# Patient Record
Sex: Male | Born: 1955 | Race: Black or African American | Hispanic: No | Marital: Married | Smoking: Never smoker
Health system: Southern US, Community
[De-identification: ages and names within clinical notes are randomized; demographics above are authoritative.]

## PROBLEM LIST (undated history)

## (undated) DIAGNOSIS — R0600 Dyspnea, unspecified: Secondary | ICD-10-CM

## (undated) DIAGNOSIS — I10 Essential (primary) hypertension: Secondary | ICD-10-CM

## (undated) DIAGNOSIS — M150 Primary generalized (osteo)arthritis: Secondary | ICD-10-CM

## (undated) DIAGNOSIS — J45909 Unspecified asthma, uncomplicated: Secondary | ICD-10-CM

## (undated) DIAGNOSIS — R51 Headache: Secondary | ICD-10-CM

## (undated) DIAGNOSIS — E785 Hyperlipidemia, unspecified: Secondary | ICD-10-CM

## (undated) DIAGNOSIS — R519 Headache, unspecified: Secondary | ICD-10-CM

## (undated) DIAGNOSIS — N289 Disorder of kidney and ureter, unspecified: Secondary | ICD-10-CM

## (undated) DIAGNOSIS — M0600 Rheumatoid arthritis without rheumatoid factor, unspecified site: Secondary | ICD-10-CM

## (undated) HISTORY — PX: BACK SURGERY: SHX140

## (undated) HISTORY — DX: Essential (primary) hypertension: I10

## (undated) HISTORY — PX: OTHER SURGICAL HISTORY: SHX169

## (undated) HISTORY — DX: Rheumatoid arthritis without rheumatoid factor, unspecified site: M06.00

## (undated) HISTORY — DX: Hyperlipidemia, unspecified: E78.5

## (undated) HISTORY — PX: TOTAL HIP ARTHROPLASTY: SHX124

## (undated) HISTORY — DX: Disorder of kidney and ureter, unspecified: N28.9

## (undated) HISTORY — PX: EYE SURGERY: SHX253

## (undated) HISTORY — DX: Primary generalized (osteo)arthritis: M15.0

---

## 2000-03-20 ENCOUNTER — Ambulatory Visit (HOSPITAL_COMMUNITY): Admission: RE | Admit: 2000-03-20 | Discharge: 2000-03-20 | Payer: Self-pay | Admitting: Orthopedic Surgery

## 2000-03-20 ENCOUNTER — Encounter: Payer: Self-pay | Admitting: Orthopedic Surgery

## 2000-12-15 ENCOUNTER — Ambulatory Visit (HOSPITAL_COMMUNITY): Admission: RE | Admit: 2000-12-15 | Discharge: 2000-12-15 | Payer: Self-pay | Admitting: Internal Medicine

## 2001-04-19 ENCOUNTER — Encounter: Payer: Self-pay | Admitting: Orthopedic Surgery

## 2001-04-19 ENCOUNTER — Ambulatory Visit (HOSPITAL_COMMUNITY): Admission: RE | Admit: 2001-04-19 | Discharge: 2001-04-19 | Payer: Self-pay | Admitting: Orthopedic Surgery

## 2001-06-23 ENCOUNTER — Encounter: Payer: Self-pay | Admitting: Specialist

## 2001-06-30 ENCOUNTER — Inpatient Hospital Stay (HOSPITAL_COMMUNITY): Admission: RE | Admit: 2001-06-30 | Discharge: 2001-07-05 | Payer: Self-pay | Admitting: Specialist

## 2001-07-01 ENCOUNTER — Encounter: Payer: Self-pay | Admitting: Specialist

## 2001-10-06 ENCOUNTER — Ambulatory Visit (HOSPITAL_COMMUNITY): Admission: RE | Admit: 2001-10-06 | Discharge: 2001-10-06 | Payer: Self-pay | Admitting: *Deleted

## 2002-05-09 ENCOUNTER — Emergency Department (HOSPITAL_COMMUNITY): Admission: EM | Admit: 2002-05-09 | Discharge: 2002-05-09 | Payer: Self-pay | Admitting: Emergency Medicine

## 2002-05-09 ENCOUNTER — Inpatient Hospital Stay (HOSPITAL_COMMUNITY): Admission: EM | Admit: 2002-05-09 | Discharge: 2002-05-10 | Payer: Self-pay | Admitting: Cardiology

## 2002-05-09 ENCOUNTER — Encounter: Payer: Self-pay | Admitting: Emergency Medicine

## 2002-05-10 ENCOUNTER — Encounter: Payer: Self-pay | Admitting: Cardiology

## 2003-11-13 ENCOUNTER — Encounter: Admission: RE | Admit: 2003-11-13 | Discharge: 2003-11-13 | Payer: Self-pay | Admitting: Internal Medicine

## 2004-01-31 ENCOUNTER — Ambulatory Visit (HOSPITAL_COMMUNITY): Admission: RE | Admit: 2004-01-31 | Discharge: 2004-01-31 | Payer: Self-pay | Admitting: *Deleted

## 2004-12-31 ENCOUNTER — Ambulatory Visit: Payer: Self-pay | Admitting: Internal Medicine

## 2006-07-22 ENCOUNTER — Emergency Department (HOSPITAL_COMMUNITY): Admission: EM | Admit: 2006-07-22 | Discharge: 2006-07-22 | Payer: Self-pay | Admitting: Emergency Medicine

## 2009-02-28 HISTORY — PX: OTHER SURGICAL HISTORY: SHX169

## 2009-03-24 ENCOUNTER — Inpatient Hospital Stay (HOSPITAL_COMMUNITY): Admission: RE | Admit: 2009-03-24 | Discharge: 2009-03-27 | Payer: Self-pay | Admitting: Specialist

## 2010-09-03 LAB — BASIC METABOLIC PANEL
BUN: 14 mg/dL (ref 6–23)
BUN: 17 mg/dL (ref 6–23)
CO2: 27 mEq/L (ref 19–32)
Calcium: 8.2 mg/dL — ABNORMAL LOW (ref 8.4–10.5)
Calcium: 8.2 mg/dL — ABNORMAL LOW (ref 8.4–10.5)
Chloride: 103 mEq/L (ref 96–112)
Creatinine, Ser: 1.14 mg/dL (ref 0.4–1.5)
GFR calc Af Amer: >60 mL/min (ref >60)
Glucose, Bld: 124 mg/dL — ABNORMAL HIGH (ref 70–99)
Potassium: 4.6 mEq/L (ref 3.5–5.1)
Potassium: 4.8 mEq/L (ref 3.5–5.1)
Sodium: 135 mEq/L (ref 135–145)

## 2010-09-03 LAB — CBC
HCT: 31.1 % — ABNORMAL LOW (ref 39.0–52.0)
HCT: 33 % — ABNORMAL LOW (ref 39.0–52.0)
Hemoglobin: 10.6 g/dL — ABNORMAL LOW (ref 13.0–17.0)
Hemoglobin: 11.1 g/dL — ABNORMAL LOW (ref 13.0–17.0)
Hemoglobin: 9 g/dL — ABNORMAL LOW (ref 13.0–17.0)
Hemoglobin: 9.6 g/dL — ABNORMAL LOW (ref 13.0–17.0)
MCHC: 33.6 g/dL (ref 30.0–36.0)
MCHC: 33.8 g/dL (ref 30.0–36.0)
MCHC: 34 g/dL (ref 30.0–36.0)
MCV: 95.5 fL (ref 78.0–100.0)
MCV: 96.3 fL (ref 78.0–100.0)
Platelets: 324 10*3/uL (ref 150–400)
Platelets: 345 10*3/uL (ref 150–400)
Platelets: 398 10*3/uL (ref 150–400)
RDW: 13 % (ref 11.5–15.5)
WBC: 3.9 10*3/uL — ABNORMAL LOW (ref 4.0–10.5)
WBC: 9.3 10*3/uL (ref 4.0–10.5)

## 2010-09-03 LAB — COMPREHENSIVE METABOLIC PANEL
ALT: 20 U/L (ref 0–53)
AST: 21 U/L (ref 0–37)
Albumin: 3.3 g/dL — ABNORMAL LOW (ref 3.5–5.2)
Alkaline Phosphatase: 69 U/L (ref 39–117)
Chloride: 104 mEq/L (ref 96–112)
GFR calc non Af Amer: >60 mL/min (ref >60)
Sodium: 139 mEq/L (ref 135–145)

## 2010-09-03 LAB — CROSSMATCH
ABO/RH(D): O POS
Antibody Screen: NEGATIVE

## 2010-09-03 LAB — URINALYSIS, ROUTINE W REFLEX MICROSCOPIC
Glucose, UA: NEGATIVE mg/dL
Hgb urine dipstick: NEGATIVE
Ketones, ur: NEGATIVE mg/dL
Nitrite: NEGATIVE
Protein, ur: NEGATIVE mg/dL
Specific Gravity, Urine: 1.026 (ref 1.005–1.030)
Urobilinogen, UA: 0.2 mg/dL (ref 0.0–1.0)
pH: 5.5 (ref 5.0–8.0)

## 2010-09-03 LAB — DIFFERENTIAL
Basophils Relative: 0 % (ref 0–1)
Eosinophils Relative: 7 % — ABNORMAL HIGH (ref 0–5)
Lymphocytes Relative: 27 % (ref 12–46)
Monocytes Absolute: 0.4 10*3/uL (ref 0.1–1.0)
Neutrophils Relative %: 56 % (ref 43–77)

## 2010-09-03 LAB — APTT: aPTT: 30 seconds (ref 24–37)

## 2010-10-16 NOTE — H&P (Signed)
[facility]  Patient:    [name] [name] Visit Number: [id number] MRN: [id number]          Service Type: Attending:  R. Valma Cava, M.D. Dictated by:   Ottie Glazier. Wynona Neat, P.A.-C.                           History and Physical  DATE OF BIRTH:  July 03, 1955  SOCIAL SECURITY #:  [id number]  CHIEF COMPLAINT:  Right knee pain.  HISTORY OF PRESENT ILLNESS:  [name] [name] is a 55 year old black man who has a long history of right knee pain beginning in the late 1970s.  The patient states that he has had an increase in frequency and duration of his pain since that time.  In fact, he has undergone an apparent open debridement in 1976, with two subsequent arthroscopies thereafter.  The patient complains again of increasing worsening pain that now interferes with his activities of daily living and his ambulation.  After a lengthy discussion with the patient concerning the options and treatment of the knee, the patient has opted for surgical intervention, and he is to undergo a total knee arthroplasty.  ALLERGIES:  The patient has no known drug allergies.  CURRENT MEDICATIONS: 1. Arthrotec 75 mg 1 p.o. q.d. p.r.n. 2. Celebrex 200 mg 1 p.o. q.d. p.r.n. 3. ______ 100/650 1 p.o. q.h.s. 4. AndroGel q.d.  PAST MEDICAL HISTORY: 1. Spinal stenosis. 2. Low back pain. 3. History of osteoarthritis of right knee.  PAST SURGICAL HISTORY:  Right knee surgery x 3.  SOCIAL HISTORY:  The patient is married.  He has one daughter.  Denies any tobacco or alcohol use.  He lives in a two-level home currently.  He has plans for possible home health physical therapy.  FAMILY HISTORY:  The patient states his mother had breast cancer as well as hyperthyroidism and arthritis.  REVIEW OF SYSTEMS:  The patient denies any recent fever or chills.  He states that he has had a long history of night sweats.  There has been no increase in weight or appetite.  HEENT:  The  patient does wear corrective lenses.  He denies any history of chronic headaches.  He does have occasional tinnitus. He denies any seeing of spots or specks.  He denies any sore throat or runny nose.  CHEST:  He denies any history of chronic cough or productive cough or hemoptysis.  CARDIOVASCULAR:  He denies any chest pain, irregular heartbeat, syncopal episodes. GENITOURINARY, GENITOURINARY:  He denies any history of diarrhea, constipation, melena, bright red stools per rectum, or dysuria. EXTREMITIES:  Please see HPI.  NEUROLOGIC:  The patient states he does have occasional right lower extremity numbness, tingling, radicular-type pain secondary to spinal stenosis.  PRIMARY CARE PHYSICIAN:  Dr. Jearl [name], who is no longer at the practice; therefore, the patient must take a new [name] [name].  He states that he does have an appointment for medical clearance.  PHYSICAL EXAMINATION:  VITAL SIGNS:  Blood pressure 128/90, respirations 16, pulse 76.  GENERAL:  Very pleasant 55 year old black male in no acute distress, appearing his stated age.  HEENT:  Head atraumatic, normocephalic.  NECK:  Supple.  Without masses.  CHEST:  Clear to auscultation bilaterally.  HEART:  Regular rate and rhythm.  S1, S2.  ABDOMEN:  Soft and nontender.  Bowel sounds positive x 4 quadrants.  EXTREMITIES:  The patient has fixed valgus of the knee with 90 degrees of flexion.  Previous surgery with lateral incision marks.  Awkward valgus thrust.  Opposite knee has a worn medial compartment.  GENITOURINARY:  Deferred.  Not pertinent to present illness.  LABORATORY DATA:  X-rays reveal valgus alignment of the right knee with a significant amount of osteoarthritic changes of the medial compartment.  ASSESSMENT: 1. Right knee osteoarthritis. 2. History of low back pain secondary to spinal stenosis.  PLAN:  The patient will be admitted to [facility] to undergo a right total knee arthroplasty per Dr. Benny Lennert on June 30, 2001, at 12:30 p.m. The risks and benefits of this procedure have been discussed with the patient in great detail, to which he states good understanding.  The patient is to obtain medical clearance today.  This H&P was taken on June 23, 2001; however, I am dictating it on July 03, 2001. Dictated by:   Ottie Glazier. Wynona Neat, P.A.-C. Attending:  R. Valma Cava, M.D. DD:  07/03/01 TD:  07/03/01 Job: [id number]

## 2010-10-16 NOTE — Discharge Summary (Signed)
[facility]  Patient:    [name], [name] Visit Number: [id number] MRN: [id number]          Service Type: SUR Location: 4W 0482 01 Attending Physician:  Erasmo Leventhal Dictated by:   Marcie Bal Velna Ochs, P.A.-C Admit Date:  06/30/2001 Discharge Date: 07/05/2001   CC:         Dr. Tonye Pearson   Discharge Summary  PRIMARY DIAGNOSES: 1. End stage osteoarthritis, right knee. 2. Postoperative hypoxemia. 3. Acute blood loss anemia. 4. Hyponatremia. 5. Hypocalcemia.  SECONDARY DIAGNOSES: 1. Hypercholesterolemia. 2. Iron-deficiency anemia. 3. Depression. 4. Hypogonadism. 5. Obesity.  SURGICAL PROCEDURE:  Right total knee arthroplasty by Dr. Thomasena Edis with the assistance of Marcie Bal. Troncale, P.A.-C on June 30, 2001.  Please see operative summary for further details.  CONSULTATIONS:  Dr. Tonye Pearson.  LABORATORY DATA:  Preoperative hemoglobin and hematocrit were 10.8 and 32.3, respectively.  This reached a low on July 02, 2001, of 8.2 and 24.6, respectively.  This improved to 9.7 hemoglobin on February 3 and July 04, 2001, after transfusion.  Preoperative PT, INR, and PTT were 13.3, 1.0, and 34, respectively.  He had a therapeutic INR up to 2.1 starting on July 03, 2001.  Routine chemistry preoperatively was all within normal limits with a sodium and potassium of 140 and 4.0, respectively.  BUN and creatinine were 9 and 1.0.  He had a low sodium on July 01, 2001, of 131.  This improved to 136 on July 04, 2001.  Also, calcium was low at 7.7 on July 02, 2001, improved to 8.4 on July 04, 2001.  Cardiac enzymes obtained on July 01, 2001, initially, and a series of three, showed CK in the range of 134 to 162, relative index of 0.4 to 0.6, CK-MB of 0.6 to 1.0.  Urinalysis on June 23, 2001, was all within normal limits with negative parameters.  A blood gas on room air on July 01, 2001, showed a pH of 7.32, PCO2  51, PO2 of 53, O2 saturation of 84.7.  EKG on July 01, 2001, showed normal sinus rhythm, no ST changes.  Chest x-ray on June 23, 2001, showed mild cardiomegaly and no active disease.  Followup on July 01, 2001, showed right basilar atelectasis.  Spiral CT of the chest showed no evidence of acute pulmonary embolism.  CHIEF COMPLAINT:  Right knee pain.  HISTORY OF PRESENT ILLNESS:  [name] [name] is a 55 year old male who presents with complaints of right knee pain he has had since the late 1970s.  He had undergone an open debridement in 1976, with two subsequently knee arthroscopies.  He has continued to have pain, his pain now is to the point that it is interfering with his activities of daily living and his ambulation. He has failed conservative measures, and now has elected to undergo surgical intervention.  He saw Dr. Lanae Boast preoperatively for preoperative medical evaluation and clearance, and it was felt that he was stable to proceed with surgery.  HOSPITAL COURSE:  Following the surgical procedure, the patient was taken to the PACU in stable condition, transferred to the orthopedic floor in good condition.  He received routine postoperative antibiotics and pain medications.  He was on Coumadin for deep vein thrombosis prophylaxis, and had a therapeutic INR prior to discharge.  On postoperative day #1, in the evening, however, he became somewhat belligerent with the staff and apparently threw his meal tray.  Then he was noted to have a decreased pulse oximetry of  80%.  I came in to evaluate the patient at that time.  The patient was found to be alert and oriented and without any chest pain, discomfort, or shortness of breath.  He was slightly tachycardic, and had a low pulse oximetry on room air.  An ABG was obtained on room air which did show an increased AA gradient as well as respiratory acidosis.  An EKG was obtained which showed normal sinus rhythm, but no ST changes.   Cardiac enzymes were ordered, as well as supplemental oxygen.  Case was discussed with Dr. Ranell Patrick who is covering for Dr. Thomasena Edis.  He agreed with the workup at that point, and recommended we contact the medical physician.  Dr. Lanae Boast was consulted, and she agreed with the initial management and recommended we continue to titrate his oxygen, obtain a chest x-ray, and a spiral CT of the chest.  Also, she recommended we start Lovenox in the meantime as deep vein thrombosis prophylaxis until the Coumadin was therapeutic.  Followup cardiac enzymes all were within normal limits with no evidence of cardiac ischemia.  CT of the chest also was negative for PE.  On July 02, 2001, he was found to have a low hemoglobin and was transfused 2 units of packed red blood cells which brought this up, and he had no further problems.  He actually is feeling better on July 02, 2001, and continued to improve at that point.  He was treated for hyponatremia and hypocalcemia by Dr. Lanae Boast.  The patient progressed slow with therapy. Continued to encourage him to participate in therapy, and he did make some slow, but notable strides with therapy.  By postoperative day #5, it was felt he was medically and orthopedically stable, and had progressed well enough for discharge home.  DISPOSITION:  The patient is being discharged home with [facility] physical therapy, as well as a home CPM machine.  DIET:  Regular.  ACTIVITY:  Weightbearing as tolerated.  Total knee protocol.  Home CPM. Aggressive physical therapy and knee range of motion.  WOUND CARE:  Once daily dressing changes to the knee.  Okay to shower once daily.  DISCHARGE MEDICATIONS: 1. Coumadin per pharmacy dosing. 2. Trinsicon one p.o. t.i.d. p.c. 3. OxyContin 20 mg p.o. b.i.d. 4. Oxy IR one p.o. q.4h. p.r.n. breakthrough pain. 5. Robaxin 500 mg p.o. q.8h. p.r.n. spasm.  FOLLOWUP: 1. Dr. Thomasena Edis in 10 days.  Call [phone] for an  appointment. 2. Dr. Lanae Boast per her instructions.   CONDITION ON DISCHARGE:  Good and improved. Dictated by:   Marcie Bal Velna Ochs, P.A.-C Attending Physician:  Erasmo Leventhal DD:  07/12/01 TD:  07/12/01 Job: [id number]

## 2010-10-16 NOTE — Discharge Summary (Signed)
NAME:  [name] [name], [name] [name]                          ACCOUNT NO.:  [id number]   MEDICAL RECORD NO.:  [id number]                   PATIENT TYPE:  INP   LOCATION:  3741                                 FACILITY:  [facility]   PHYSICIAN:  Jonelle Sidle, M.D. [facility]        DATE OF BIRTH:  [birth year]   DATE OF ADMISSION:  05/09/2002  DATE OF DISCHARGE:  05/10/2002                           DISCHARGE SUMMARY - REFERRING   PROCEDURES:  1. Exercise stress Cardiolite, May 10, 2002.  2. Chest CT scan.   REASON FOR ADMISSION:  Please refer to dictated admission note.   LABORATORY DATA:  WBC 4.1, hemoglobin 11.3, hematocrit 33.3, platelets  389,000.  D-dimer 1.00, INR 0.9.  Sodium 137, potassium 4.2, glucose 118,  BUN 10, creatinine 1.1; decreased albumin at 3.2, otherwise, normal liver  enzymes.  Cardiac enzymes:  CPK-MB and troponin I markers normal (x4).  Lipid panel:  Total cholesterol 184, triglycerides 49, HDL 38, LDL 136  (ratio 4.8).  Urinalysis negative.   Admission CXR:  No active disease.   HOSPITAL COURSE:  The patient presented with chest pain which was felt to be  atypical for ischemic heart disease.  Serial EKGs showed no definite  ischemia.  Serial cardiac enzymes were all within normal limits.  The  patient was thus referred for exercise stress Cardiolite testing.   Exercise stress test notable for no report of chest pain during procedure.  Hypertensive response was noted (200/100).  Perfusion images revealed no  evidence of ischemia; calculated ejection fraction of 60%.   Given the elevated D-dimer (1.0) on admission, the patient was also referred  for a CT scan of the chest; this was negative for pulmonary embolus as well.   No further cardiac workup was recommended.  The patient will be discharged  on Protonix.   Of note, the CT scan of the chest did also reveal a 1.8-cm right hilar node.  The patient reports no history of tobacco smoking.  Recommendation is to  have a  followup CT scan in three months.   The patient apparently has no primary care physician and will need to  establish with one within the next several weeks.   MEDICATIONS AT DISCHARGE:  Protonix 40 mg every day.   DISCHARGE INSTRUCTIONS:  Resume previous level of activity as tolerated.   The patient is strongly urged to re-establish with her primary care  physician.   DISCHARGE DIAGNOSIS:  1. Non-ischemia chest pain.     a. Normal serial cardiac enzymes.     b. Negative exercise stress Cardiolite.  2. Elevated D-dimer.     a. Negative chest CT scan for pulmonary embolus.  3. Right hilar node.     a. Recommend repeat CT scan in three months.  4. Dyslipidemia.  5. History of anxiety.  6. Childhood asthma.       Gene Serpe, P.A. [facility]  Jonelle Sidle, M.D. [facility]    GS/MEDQ  D:  05/10/2002  T:  05/11/2002  Job:  [id number]

## 2010-10-16 NOTE — Procedures (Signed)
[facility]  Patient:    AZION, CENTRELLA Visit Number: [id number] MRN: [id number]          Service Type: END Location: ENDO Attending Physician:  Sabino Gasser Dictated by:   Sabino Gasser, M.D. Proc. Date: 10/06/01 Admit Date:  10/06/2001                             Procedure Report  PROCEDURE:  Upper endoscopy.  INDICATIONS:  GERD.  ANESTHESIA:  Demerol 80 mg, Versed 8 mg.  DESCRIPTION OF PROCEDURE:  With the patient mildly sedated in the left lateral decubitus position, the Olympus videoscopic endoscope was inserted in the mouth and passed under direct vision through the esophagus, which appeared normal, into the stomach through a hiatal hernia. The fundus, body, antrum, duodenal bulb, second portion of duodenum were viewed. From this point, the endoscope was slowly withdrawn taking circumferential views of the entire duodenal mucosa until the endoscope was then pulled back into the stomach and placed in retroflexion to view the stomach from below, and a hiatal hernia was once again seen. The endoscope was straightened and withdrawn taking circumferential views of the remaining gastric and esophageal mucosa. The patients vital signs and pulse oximeter remained stable. The patient tolerated the procedure well without apparent complications.  FINDINGS:  Hiatal hernia, otherwise unremarkable examination.  PLAN:  Continue present therapy and proceed to colonoscopy as planned. Dictated by:   Sabino Gasser, M.D. Attending Physician:  Sabino Gasser DD:  10/06/01 TD:  10/07/01 Job: [id number]

## 2010-10-16 NOTE — Procedures (Signed)
[facility]  Patient:    AUGUSTINO, SAVASTANO Visit Number: [id number] MRN: [id number]          Service Type: END Location: ENDO Attending Physician:  Sabino Gasser Dictated by:   Sabino Gasser, M.D. Proc. Date: 10/06/01 Admit Date:  10/06/2001                             Procedure Report  PROCEDURE:  Colonoscopy.  INDICATIONS:  Hemoccult positivity. Colon cancer screening.  ANESTHESIA:  Demerol 10 mg, Versed 1 mg additionally.  DESCRIPTION OF PROCEDURE:  With the patient mildly sedated in the left lateral decubitus position, the Olympus videoscopic colonoscope was inserted in the rectum after normal rectal exam and passed under direct vision to the cecum, identified by the ileocecal valve and appendiceal orifice, the latter of which was photographed. From this point, the colonoscope was slowly withdrawn taking circumferential views of the entire colonic mucosa stopping only then in then in the rectum which appeared normal in direct and retroflex view. The endoscope was straightened and withdrawn. The patients vital signs and pulse oximeter remained stable. The patient tolerated the procedure well without apparent complications.  FINDINGS:  Unremarkable colonoscopic examination.  PLAN:  Have patient follow up with Dr. Virginia Rochester as an outpatient. Dictated by:   Sabino Gasser, M.D. Attending Physician:  Sabino Gasser DD:  10/06/01 TD:  10/07/01 Job: [id number]

## 2010-10-16 NOTE — Op Note (Signed)
[facility]  Patient:    [name] [name], [name] [name] Visit Number: [id number] MRN: [id number]          Service Type: SUR Location: 4W 0482 01 Attending Physician:  Erasmo Leventhal Dictated by:   Elvera Lennox Valma Cava, M.D. Proc. Date: 06/30/01 Admit Date:  06/30/2001                             Operative Report  PREOPERATIVE DIAGNOSIS:  Right knee severe valgus malalignment with end-stage osteoarthritis.  POSTOPERATIVE DIAGNOSIS:  Right knee severe valgus malalignment with end-stage osteoarthritis.  PROCEDURE:  Right total knee arthroplasty.  SURGEON:  R. Valma Cava, M.D.  ASSISTANT:  Irena Cords, P.A.-C.  ANESTHESIA:  General.  ESTIMATED BLOOD LOSS:  Less than 50 cc.  DRAINS:  Two mini Hemovac.  COMPLICATIONS:  None.  TOURNIQUET TIME:  2 hours and 10 minutes at 375 mmHg.  COMPLICATIONS:  None.  DISPOSITION:  To PACU stable.  OPERATIVE IMPLANTS:  Osteonics components.  Posterior stabilized.  Size 9 femur, size 9 tibia, 10 mm polyethylene insert with a 26 mm patella.  All components cemented.  OPERATIVE DETAILS:  The patient had been counseled in the holding area and correct site was identified.  IV started.  Antibiotics were given.  The patient refused to have a Foley catheter placed before the surgical procedure.  He understands the risks and benefits, and he wished to proceed. He was taken to the OR, placed in a supine position, general anesthesia.  The right knee was examined, severe valgus malalignment and 10 degree flexion contracture.  He could only flex to 90 degrees.  Elevated, prepped with DuraPrep, and all draped in a sterile fashion.  Exsanguinated with an Esmarch, tourniquet was inflated to 375 mmHg due to the large size of his thigh.  He had two previous medial and lateral arthrotomy incisions from 1972.  This incision I used today was ______ down to preserve skin flaps, was made in the midline, somewhat biased in  towards the lateral sides.  Dissection and skin and subcutaneous tissue, medial and lateral skin flaps were developed at the appropriate level, and veins were electrocoagulated.  Medial parapatellar arthrotomy was performed, and the medial soft tissue over the proximal tibia was exposed, doing very minimal to no release there.  This patient had a very deformed knee with large osteophytes.  The osteophytes were removed from the patella and then from the patellofemoral joint and femoral trochlea, exposing the natural size of his femur.  The ACL was absent. The PCL was resected.  Posterior neurovascular structures were protected throughout the entire case and ______ off.  A starting hole made in the distal femur; canal was entered.  We chose a 7 degree valgus to try to correct him without injuring his common peroneal nerve with excessive postoperative correction.  Intramedullary awl was placed, set for 7 degree valgus cut and took a 12 mm cut off the distal femur which was adequate.  The distal femur was found to be a size #9.  Rotational marks were made, and the distal femur was cut to fit a size #9.  More osteophytes were also removed.  The tibia was very difficult.  The medial meniscus was resected.  Hemostasis was obtained in the lateral side.  There was no lateral meniscus remaining. He had a very scalloped sclerotic lateral tibial plateau.  This was a very difficult dissection to say the least.  Tibial eminence  was resected.  The proximal tibia was found to be a size #9.  Starting hole was made.  Step reamer was utilized, and the canal was irrigated until the effluent was clear. Intramedullary rod was gently placed.  I chose a 5 degree posterior slope and a 10 mm cut based upon the medial side which was the preserved side.  This was done, protecting the posterior neurovascular structures and the collateral ligaments and patella and extensor mechanism.  The proximal tibia was  cut appropriately.  It was found to be a size #9 again.  Posteromedial and posterolateral femoral osteophytes were removed under direct visualization, as they were also taken off the back of the tibia under direct visualization, again protecting the posterior neurovascular structures.  The femoral trochlea was prepared in a standard fashion.  At this point in time, with a size #9 femur, size #9 tibia with a 10 mm insert trial, we had excellent range of motion and soft tissue balance and alignment.  Rotational marks were made in the proximal tibia.  The Delta keel was performed in a standard fashion.  The lateral tibia bone was rather sclerotic, and drill holes were there to help with cement fixation.  The patella was found to be a size #26.  It was reamed to a depth of 10 mm. Locking holes were made.  At this time, utilizing a Modern cement technique, all components were cemented into place, size 9 tibia, size 9 femur with a 26 mm patella.  While the cement cured, we placed a 10 mm insert.  At this point, we had the 10 mm trial insert.  We had excellent alignment, range of motion, soft tissue balance.  Patella tracking was a little bit tilting laterally; therefore, a lateral release was performed, giving anatomic patellofemoral tracking. Geniculate vessels were identified and spared superolateral as coagulated.  The tibial trial was removed.  Excess cement was removed, and a final 10 mm tibial insert was placed.  Bone wax was placed on exposed bony surfaces.  A meticulous hemostasis was performed.  Two mini Hemovac drains were placed.  A sequential closure of layers was done, synovium with Vicryl, arthrotomy Vicryl, subcu Vicryl.  At this point in time, we were pressed on the tourniquet time, and I went ahead and stapled the skin also to make sure that after surgery if he had any areas of skin not being viable, that we could take out the staples at the appropriate level.  Then 20  cc of 0.5% Marcaine with epinephrine was put into the knee joint through  the drain.  A sterile compressive dressing was applied, and the tourniquet was deflated.  He had a normal pulse and circulation in the foot and ankle at the end of the case.  He was then awakened and extubated.  At this point in time, with the patient awake, I could make sure he could he could dorsiflex and planter flexion the foot well, and there was no motor, peroneal nerve dysfunction at all.  With that in mind, we then put him into a knee immobilizer in full extension.  Another gram of Ancef was given intravenously at the end of the case.  Sponge and needle counts were correct.  There were no complications.  He was awakened and had been extubated and taken from the operating room to PACU in stable condition.  ADDENDUM:  This was a very difficult knee due to the large body habitus of the patient and the severe knee  deformity that he had.  There were no complications noted at this time. Dictated by:   R. Valma Cava, M.D. Attending Physician:  Erasmo Leventhal DD:  06/30/01 TD:  07/01/01 Job: [id number]

## 2010-10-16 NOTE — Consult Note (Signed)
[facility]  Patient:    [name] [name], [name] [name] Visit Number: [id number] MRN: [id number]          Service Type: SUR Location: 4W [id number] 01 Attending Physician:  Erasmo Leventhal Dictated by:   Lilia Pro, M.D. Admit Date:  06/30/2001   CC:         R. Valma Cava, M.D.   Consultation Report  REASON FOR CONSULTATION:  Hypoxemia and hyponatremia and general medical management.  HISTORY:  This is a 55 year old African American male who was previously under the primary care of Dr. Trudee Kuster and whom I saw a couple of weeks ago for surgical clearance.  He underwent right total knee replacement on July 01, 2001, and was doing well postoperatively, up until last evening, when he developed some confusion, lethargy, and hypoxemia.  The PA for Dr. Thomasena Edis saw him and started him on some oxygen and ordered a CT scan of his chest which was negative for PE.  We discussed this briefly on the phone prior to him doing that.  I also recommended he start him on Lovenox at therapeutic doses which was done.  This morning, the patient is somewhat lethargic, but he says this is due to his medication.  The staff reports he is much better mentally than he was last night.  His pain is under good control with p.o. Percocet.  He has no complaints of chest pain or shortness of breath at this time.  No nausea or vomiting.  He is taking p.o. well.  PAST MEDICAL HISTORY:  He has a past medical history of: 1. Hypercholesterolemia. 2. Iron-deficiency anemia. 3. Depression. 4. Hypogonadism. 5. Severe osteoarthritis of the right knee. 6. Obesity.  PAST FAMILY/SOCIAL HISTORY:  I believe he may have a family history of coronary artery disease.  No history of diabetes.  He does work full time.  He lives with his wife.  He does not smoke tobacco or drink alcohol.  MEDICATIONS:  1. Colace b.i.d.  2. Iron tablet t.i.d.  3. Coumadin which was just started.  4. Morphine  p.r.n.  5. Lovenox which was just started.  6. Cefazolin IV.  7. Compazine p.r.n.  8. Reglan.  9. Zofran p.r.n. 10. Percocet p.r.n.  DRUG ALLERGIES:  None.  PHYSICAL EXAMINATION:  VITAL SIGNS:  Stable.  He is currently 97% on 4 L.  He is somewhat tachycardic with a heart rate of 107.  He has been so since admission.  GENERAL:  A well-developed African American male in no apparent distress, quite pleasant, somewhat lethargic, but answers all questions correctly and appropriately.  HEENT:  Extraocular movements intact.  Sclerae clear.  Oropharynx clear.  NECK:  Supple.  No lymphadenopathy.  HEART:  Regular rhythm with a tachycardic rate.  No murmurs auscultated.  LUNGS:  Clear to auscultation and nonlabored bilaterally.  ABDOMEN:  Nondistended, nontender, soft with positive bowel sounds.  No hepatosplenomegaly appreciated.  EXTREMITIES:  No clubbing, cyanosis, or edema.  NEUROLOGIC:  Cranial nerves II-XII, gait, of course, is not tested.  LABORATORY WORK:  Shows a hemoglobin that is 8.2 this morning.  This is down from 9.4 on admission.  I should say he was about 10 in the office when I saw him for clearance.  He has a sodium of 131, potassium 3.6, BUN 10, creatinine 1.1, calcium 7.7.  IMPRESSION/PLAN: 1. Agree with CT of chest, although I am glad this is negative for any PE.    Agree with oxygen supplementation for the  current time.  I think we should    continue the Lovenox at therapeutic dose for potential PE complications    in the future and until his Coumadin becomes therapeutic.     We will watch this closely.  I will check stool for blood while he is    here and do p.r.n. transfusions.  I agree with the slow and careful    transfusion with his risks for congestive heart failure.  I am in the    process of working up medical issues as an outpatient and I do believe    he has some risk for this with his high cholesterol and obesity and    family history. 2.  Hyponatremia.  I have changed his IV fluids to normal saline.  I will    Heplock this when he is getting blood and as he continues to take p.o.    we will simply Heplock his IV which will correct the sodium in and of    itself. 3. Hypocalcemia.  I will correct this p.o.  Thank you for this consult. Dictated by:   Lilia Pro, M.D. Attending Physician:  Erasmo Leventhal DD:  07/02/01 TD:  07/03/01 Job: [id number]

## 2010-10-16 NOTE — H&P (Signed)
NAME:  [name] [name], [name] [name] NO.:  [id number]   MEDICAL RECORD NO.:  [id number]                   PATIENT TYPE:  EMS   LOCATION:  ED                                   FACILITY:  [facility]   PHYSICIAN:  Jonelle Sidle, M.D. [facility]        DATE OF BIRTH:  07/11/1955   DATE OF ADMISSION:  05/09/2002  DATE OF DISCHARGE:                                HISTORY & PHYSICAL   CHIEF COMPLAINT:  Chest discomfort.   HISTORY OF PRESENT ILLNESS:  Mr. Bonsignore is a pleasant, 55 year old male with  no major medical problems, generally in good health, who developed chest  discomfort on Monday while he was seated at his desk at work.  He describes  this as if somewhat had hit him in the chest, and states that this symptom  was fairly constant, without significant radiation to the neck but  associated with some twinging in his right arm.  Symptoms waxed and waned,  and returned later that evening when he was out shopping with his wife.  He  stayed home from work the next day and last evening developed recurrent  symptoms throughout the night and into this morning, prompting evaluation in  the emergency department.  He describes a pleuritic character to the  discomfort.  He has had no cough, fever or chills although he did take some  TheraFlu thinking he may have had an upper respiratory infection.  When he  received aspirin in the emergency department, his pain subsided.  He has  noted no positional change in intensity of pain.  He has had no similar  symptoms in the past.  At present, he is comfortable without complaints.   ALLERGIES:  Penicillin.   CURRENT MEDICATIONS:  None regularly, he has taken some aspirin and TheraFlu  as needed.   PAST MEDICAL HISTORY:  1. Status post right knee replacement.  2. No known history of type 2 diabetes mellitus, dyslipidemia, hypertension,     coronary artery disease, myocardial infarction or arrhythmia.  3. Apparent history of anxiety in  the past, previously treated with Effexor.  4. History of childhood asthma.   SOCIAL HISTORY:  The patient is married and lives in [place].  He has a  39 year old daughter.  He works at the [employer] and has  no tobacco or alcohol use history.  He denies illicit drug use.   FAMILY HISTORY:  Noncontributory for premature coronary artery disease.   REVIEW OF SYSTEMS:  As described in the History of Present Illness.  He has  had some recent nose bleeds.   PHYSICAL EXAMINATION:  VITAL SIGNS:  Blood pressure initially up to 173/92,  heart rate 73 and regular, temperature 97.0 degrees, respirations 20, blood  pressure ultimately down to 129/63.  GENERAL:  This is a well-nourished male, seated, in no acute distress.  HEENT:  Conjunctivae and lids are normal.  Oropharynx is clear.  NECK:  Supple without elevated jugular venous pressure, carotid bruits, no  thyromegaly is noted.  LUNGS:  Clear to auscultation bilaterally with non-labored breathing at  rest.  CARDIOVASCULAR EXAM:  Reveals a regular rate and rhythm without pericardial  rub, S3 gallop or significant murmur.  ABDOMEN:  Soft, nontender, no hepatosplenomegaly or bruits.  EXTREMITIES:  Exhibit no clubbing, cyanosis or edema for a pulse of 2+.  SKIN:  No ulcer changes and no kyphosis noted.  NEUROPSYCHIATRIC:  Patient is alert and oriented times three.  Affect is  normal.   LABORATORY DATA:  Shows a hematocrit of 33.3, WBC is 4.1, platelets 389, BUN  10, creatinine 1.1, potassium 4.2, glucose 118, CK 142, CK MB 1.5, troponin  I 0.03, INR 0.9.   12-lead electrocardiogram shows probable normal sinus rhythm with  nonspecific T-wave changes.  The P-wave axis is somewhat abnormal.   Chest x-ray is reported as showing borderline cardiomegaly with no acute  cardiopulmonary process.   IMPRESSION:  1. Atypical chest pain, with some pleuritic features in a 55 year old male     with no major cardiac risk factors and  no history of coronary artery     disease and myocardial infraction.  12-lead electrocardiogram is non-     specific and initial cardiac enzymes are negative.  2. History of anxiety in the past, treated with Effexor previously.  3. Remote history of asthma.   PLAN:  Will admit the patient for observation and continue to cycle cardiac  enzymes.  Would probably hold on anticoagulation at this point and  tentatively plan on an exercise Cardiolite, assuming enzymes are negative ad  the patient has no major recurrences.  Will also check a D-dimer level  though pulmonary embolus seems unlikely.  No clear evidence of pericardial  effusion or pericarditis based on exam, will continue to follow.                                               Jonelle Sidle, M.D. [facility]    SGM/MEDQ  D:  05/09/2002  T:  05/09/2002  Job:  [id number]

## 2013-02-05 ENCOUNTER — Other Ambulatory Visit: Payer: Self-pay | Admitting: Nurse Practitioner

## 2013-02-05 ENCOUNTER — Ambulatory Visit
Admission: RE | Admit: 2013-02-05 | Discharge: 2013-02-05 | Disposition: A | Discharge status: Home / Self Care | Payer: 59 | Source: Ambulatory Visit | Attending: Nurse Practitioner | Admitting: Nurse Practitioner

## 2013-02-05 DIAGNOSIS — M79671 Pain in right foot: Secondary | ICD-10-CM

## 2013-02-13 ENCOUNTER — Encounter: Payer: Self-pay | Admitting: *Deleted

## 2013-02-13 DIAGNOSIS — M0600 Rheumatoid arthritis without rheumatoid factor, unspecified site: Secondary | ICD-10-CM | POA: Insufficient documentation

## 2013-02-13 DIAGNOSIS — I10 Essential (primary) hypertension: Secondary | ICD-10-CM | POA: Insufficient documentation

## 2013-03-22 ENCOUNTER — Ambulatory Visit (INDEPENDENT_AMBULATORY_CARE_PROVIDER_SITE_OTHER): Payer: 59 | Admitting: *Deleted

## 2013-03-22 DIAGNOSIS — M79672 Pain in left foot: Secondary | ICD-10-CM

## 2013-03-22 NOTE — Progress Notes (Signed)
Dispensed orthotics and wearing instructions.  Pt asked how he would progress after surgery.  I instructed pt to wear the orthotic in the non-surgery shoe and ask Dr Al Corpus when to begin the orthotic with his surgical foot.

## 2013-03-22 NOTE — Patient Instructions (Signed)

## 2013-04-19 ENCOUNTER — Other Ambulatory Visit: Payer: Self-pay | Admitting: Podiatry

## 2013-04-19 MED ORDER — PROMETHAZINE HCL 25 MG PO TABS: 25.0000 mg | ORAL_TABLET | Freq: Three times a day (TID) | ORAL | Status: DC | PRN

## 2013-04-19 MED ORDER — CEPHALEXIN 500 MG PO CAPS: 500.0000 mg | ORAL_CAPSULE | Freq: Three times a day (TID) | ORAL | Status: DC

## 2013-04-19 MED ORDER — OXYCODONE-ACETAMINOPHEN 10-325 MG PO TABS: ORAL_TABLET | ORAL | Status: DC

## 2013-04-20 ENCOUNTER — Encounter: Payer: Self-pay | Admitting: Podiatry

## 2013-04-20 DIAGNOSIS — M898X9 Other specified disorders of bone, unspecified site: Secondary | ICD-10-CM

## 2013-04-24 ENCOUNTER — Ambulatory Visit: Payer: 59 | Admitting: Podiatry

## 2013-04-24 ENCOUNTER — Ambulatory Visit (INDEPENDENT_AMBULATORY_CARE_PROVIDER_SITE_OTHER): Payer: 59

## 2013-04-24 ENCOUNTER — Encounter: Payer: Self-pay | Admitting: Podiatry

## 2013-04-24 VITALS — BP 147/91 | HR 84 | Temp 98.5°F | Resp 16 | Ht 71.0 in

## 2013-04-24 DIAGNOSIS — Z9889 Other specified postprocedural states: Secondary | ICD-10-CM

## 2013-04-24 NOTE — Progress Notes (Signed)
Mr. Girtman presents today for followup of his dorsal tarsal exostectomy x1 week. He states that the numbness has resolved going to his toes. He's had very little pain postoperatively.  Objective: Vital signs are stable he is alert and oriented x3. Dry sterile dressing was intact once removed reveals incision line dorsal aspect of the right foot to be well coapted sutures are in place and intact. Radiographic evaluation demonstrates well-healing surgical site with complete exostectomy.  Assessment: Well-healing surgical foot status post 1 week dorsal tarsal exostectomy right.  Plan: Redressed today with a dry sterile compressive dressing continue to stay off of as much as possible keep it elevated.

## 2013-05-01 ENCOUNTER — Encounter: Payer: Self-pay | Admitting: Podiatry

## 2013-05-01 ENCOUNTER — Ambulatory Visit (INDEPENDENT_AMBULATORY_CARE_PROVIDER_SITE_OTHER): Payer: 59 | Admitting: Podiatry

## 2013-05-01 VITALS — BP 168/92 | HR 74 | Resp 16

## 2013-05-01 DIAGNOSIS — Z9889 Other specified postprocedural states: Secondary | ICD-10-CM

## 2013-05-01 NOTE — Progress Notes (Signed)
[name] [name] presents today for followup of his dorsal tarsal exostectomy right foot. He denies fever chills nausea vomiting muscle aches or pains.  Objective: Dry sterile dressing was removed today demonstrating a slightly swollen surgical site dorsal aspect of the right foot margins appear to be well coapted sutures are intact. We went ahead and removed the sutures today.  Assessment: Well-healing surgical foot.  Plan: I instructed him to start washing the foot and massaging the surgical site. I suggested motion and hand cream with vitamin E oil to the surgical site. I will followup with him in 2 weeks at which time we will try to get him back into a pair tennis shoes x-rays will also be taken at that time. He will call sooner if needed

## 2013-05-15 ENCOUNTER — Encounter: Payer: 59 | Admitting: Podiatry

## 2013-06-05 ENCOUNTER — Ambulatory Visit (INDEPENDENT_AMBULATORY_CARE_PROVIDER_SITE_OTHER): Payer: 59

## 2013-06-05 ENCOUNTER — Ambulatory Visit (INDEPENDENT_AMBULATORY_CARE_PROVIDER_SITE_OTHER): Payer: 59 | Admitting: Podiatry

## 2013-06-05 ENCOUNTER — Encounter: Payer: Self-pay | Admitting: Podiatry

## 2013-06-05 VITALS — BP 125/79 | HR 82 | Resp 16

## 2013-06-05 DIAGNOSIS — Z9889 Other specified postprocedural states: Secondary | ICD-10-CM

## 2013-06-05 NOTE — Progress Notes (Signed)
He presents today postop 04/20/2013 dorsal tarsal exostectomy right foot. Continues to wear his Darco shoe.  Objective: Vital signs are stable he is alert and oriented x3. There is no erythema edema cellulitis drainage or odor. He does have a pocket of fluid collection to the dorsal aspect of the foot overlying the surgical site. The scar is firm and tight overlying the site.  Assessment: Some residual fluid collection overlying the incision site. Otherwise well-healing surgical dorsal tarsal exostectomy right foot.  Plan: I would allow him to get back to work without his Darco shoe and I will followup with him on an as-needed basis. I encouraged massage therapy to the foot.

## 2013-06-11 NOTE — Progress Notes (Signed)
1) Tarsal exostectomy right foot

## 2014-02-07 ENCOUNTER — Ambulatory Visit
Admission: RE | Admit: 2014-02-07 | Discharge: 2014-02-07 | Disposition: A | Discharge status: Home / Self Care | Payer: 59 | Source: Ambulatory Visit | Attending: Internal Medicine | Admitting: Internal Medicine

## 2014-02-07 ENCOUNTER — Other Ambulatory Visit: Payer: Self-pay | Admitting: Internal Medicine

## 2014-02-07 DIAGNOSIS — R05 Cough: Secondary | ICD-10-CM

## 2014-02-07 DIAGNOSIS — R059 Cough, unspecified: Secondary | ICD-10-CM

## 2014-02-07 DIAGNOSIS — R062 Wheezing: Secondary | ICD-10-CM

## 2014-02-25 ENCOUNTER — Ambulatory Visit (HOSPITAL_BASED_OUTPATIENT_CLINIC_OR_DEPARTMENT_OTHER): Payer: 59

## 2014-04-06 ENCOUNTER — Emergency Department (HOSPITAL_COMMUNITY)
Admission: EM | Admit: 2014-04-06 | Discharge: 2014-04-07 | Disposition: A | Discharge status: Home / Self Care | Payer: 59 | Attending: Emergency Medicine | Admitting: Emergency Medicine

## 2014-04-06 ENCOUNTER — Emergency Department (HOSPITAL_COMMUNITY): Payer: 59

## 2014-04-06 ENCOUNTER — Encounter (HOSPITAL_COMMUNITY): Payer: Self-pay | Admitting: Emergency Medicine

## 2014-04-06 DIAGNOSIS — R0602 Shortness of breath: Secondary | ICD-10-CM | POA: Diagnosis present

## 2014-04-06 DIAGNOSIS — E873 Alkalosis: Secondary | ICD-10-CM | POA: Insufficient documentation

## 2014-04-06 DIAGNOSIS — R064 Hyperventilation: Secondary | ICD-10-CM

## 2014-04-06 DIAGNOSIS — I10 Essential (primary) hypertension: Secondary | ICD-10-CM | POA: Diagnosis not present

## 2014-04-06 DIAGNOSIS — M0600 Rheumatoid arthritis without rheumatoid factor, unspecified site: Secondary | ICD-10-CM | POA: Diagnosis not present

## 2014-04-06 DIAGNOSIS — R05 Cough: Secondary | ICD-10-CM | POA: Insufficient documentation

## 2014-04-06 LAB — BASIC METABOLIC PANEL
Anion gap: 15 (ref 5–15)
BUN: 15 mg/dL (ref 6–23)
CO2: 22 mEq/L (ref 19–32)
Calcium: 9.2 mg/dL (ref 8.4–10.5)
Chloride: 102 mEq/L (ref 96–112)
Creatinine, Ser: 1.19 mg/dL (ref 0.50–1.35)
GFR calc Af Amer: 76 mL/min — ABNORMAL LOW (ref >90)
GFR, EST NON AFRICAN AMERICAN: 65 mL/min — AB (ref >90)
Glucose, Bld: 77 mg/dL (ref 70–99)
POTASSIUM: 4.1 meq/L (ref 3.7–5.3)
SODIUM: 139 meq/L (ref 137–147)

## 2014-04-06 LAB — I-STAT ARTERIAL BLOOD GAS, ED
ACID-BASE EXCESS: 5 mmol/L — AB (ref 0.0–2.0)
BICARBONATE: 25.7 meq/L — AB (ref 20.0–24.0)
O2 Saturation: 100 %
PO2 ART: 349 mmHg — AB (ref 80.0–100.0)
Patient temperature: 98.6
TCO2: 27 mmol/L (ref 0–100)
pCO2 arterial: 25.8 mmHg — ABNORMAL LOW (ref 35.0–45.0)
pH, Arterial: 7.608 (ref 7.350–7.450)

## 2014-04-06 LAB — I-STAT TROPONIN, ED: Troponin i, poc: 0.01 ng/mL (ref 0.00–0.08)

## 2014-04-06 LAB — I-STAT CG4 LACTIC ACID, ED: Lactic Acid, Venous: 2.47 mmol/L — ABNORMAL HIGH (ref 0.5–2.2)

## 2014-04-06 LAB — I-STAT VENOUS BLOOD GAS, ED
ACID-BASE EXCESS: 1 mmol/L (ref 0.0–2.0)
BICARBONATE: 25.6 meq/L — AB (ref 20.0–24.0)
O2 Saturation: 87 %
PO2 VEN: 54 mmHg — AB (ref 30.0–45.0)
TCO2: 27 mmol/L (ref 0–100)
pCO2, Ven: 41.9 mmHg — ABNORMAL LOW (ref 45.0–50.0)
pH, Ven: 7.393 — ABNORMAL HIGH (ref 7.250–7.300)

## 2014-04-06 LAB — ETHANOL: Alcohol, Ethyl (B): <11 mg/dL (ref 0–11)

## 2014-04-06 LAB — CBC
HEMATOCRIT: 35.7 % — AB (ref 39.0–52.0)
Hemoglobin: 12 g/dL — ABNORMAL LOW (ref 13.0–17.0)
MCH: 30.2 pg (ref 26.0–34.0)
MCHC: 33.6 g/dL (ref 30.0–36.0)
MCV: 89.9 fL (ref 78.0–100.0)
Platelets: 406 10*3/uL — ABNORMAL HIGH (ref 150–400)
RBC: 3.97 MIL/uL — ABNORMAL LOW (ref 4.22–5.81)
RDW: 12.7 % (ref 11.5–15.5)
WBC: 4.2 10*3/uL (ref 4.0–10.5)

## 2014-04-06 LAB — PRO B NATRIURETIC PEPTIDE: Pro B Natriuretic peptide (BNP): 59.3 pg/mL (ref 0–125)

## 2014-04-06 LAB — SALICYLATE LEVEL

## 2014-04-06 MED ORDER — LORAZEPAM 2 MG/ML IJ SOLN
1.0000 mg | Freq: Once | INTRAMUSCULAR | Status: AC
  Administered 2014-04-06: 1 mg via INTRAVENOUS
  Filled 2014-04-06: qty 1

## 2014-04-06 MED ORDER — IOHEXOL 350 MG/ML SOLN
100.0000 mL | Freq: Once | INTRAVENOUS | Status: AC | PRN
  Administered 2014-04-06: 100 mL via INTRAVENOUS

## 2014-04-06 NOTE — ED Provider Notes (Signed)
CSN: [id number]     Arrival date & time 04/06/14  2000 History   First MD Initiated Contact with Patient 04/06/14 2030     Chief Complaint  Patient presents with  . Shortness of Breath     (Consider location/radiation/quality/duration/timing/severity/associated sxs/prior Treatment) Patient is a 58 y.o. male presenting with shortness of breath. The history is provided by the patient.  Shortness of Breath Severity:  Moderate Onset quality:  Gradual Timing:  Constant Progression:  Unchanged Chronicity:  New Context: URI   Relieved by:  Nothing Worsened by:  Nothing tried Associated symptoms: cough   Associated symptoms: no abdominal pain, no chest pain, no fever and no vomiting     Past Medical History  Diagnosis Date  . Rheumatoid arthritis without rheumatoid factor   . Hypertension    Past Surgical History  Procedure Laterality Date  . Knee replaced Bilateral 02/2009  . Tarsal exostectomy right foot     Family History  Problem Relation Age of Onset  . Cancer Mother   . Rheum arthritis Mother    History  Substance Use Topics  . Smoking status: Never Smoker   . Smokeless tobacco: Not on file  . Alcohol Use: No    Review of Systems  Constitutional: Negative for fever and chills.  Respiratory: Positive for cough and shortness of breath.   Cardiovascular: Negative for chest pain and leg swelling.  Gastrointestinal: Negative for vomiting and abdominal pain.  All other systems reviewed and are negative.     Allergies  Shrimp  Home Medications   Prior to Admission medications   Medication Sig Start Date End Date Taking? Authorizing Provider  Cholecalciferol (VITAMIN D PO) Take by mouth.    Historical Provider, MD  Valsartan (DIOVAN PO) Take by mouth.    Historical Provider, MD   BP 157/88 mmHg  Pulse 78  Temp(Src) 97.5 F (36.4 C) (Oral)  Resp 17  Ht 5\' 10"  (1.778 m)  Wt 270 lb (122.471 kg)  BMI 38.74 kg/m2  SpO2 100% Physical Exam  Constitutional:  He is oriented to person, place, and time. He appears well-developed and well-nourished. He appears distressed.  HENT:  Head: Normocephalic and atraumatic.  Mouth/Throat: No oropharyngeal exudate.  Eyes: EOM are normal. Pupils are equal, round, and reactive to light.  Neck: Normal range of motion. Neck supple.  Cardiovascular: Normal rate and regular rhythm.  Exam reveals no friction rub.   No murmur heard. Pulmonary/Chest: Breath sounds normal. He is in respiratory distress (mild). He has no wheezes. He has no rales. He exhibits no tenderness.  Abdominal: He exhibits no distension. There is no tenderness. There is no rebound.  Musculoskeletal: Normal range of motion. He exhibits no edema.  Neurological: He is alert and oriented to person, place, and time.  Skin: He is not diaphoretic.  Nursing note and vitals reviewed.   ED Course  Procedures (including critical care time) Labs Review Labs Reviewed  CBC - Abnormal; Notable for the following:    RBC 3.97 (*)    Hemoglobin 12.0 (*)    HCT 35.7 (*)    Platelets 406 (*)    All other components within normal limits  I-STAT CG4 LACTIC ACID, ED - Abnormal; Notable for the following:    Lactic Acid, Venous 2.47 (*)    All other components within normal limits  I-STAT ARTERIAL BLOOD GAS, ED - Abnormal; Notable for the following:    pH, Arterial 7.608 (*)    pCO2 arterial 25.8 (*)  pO2, Arterial 349.0 (*)    Bicarbonate 25.7 (*)    Acid-Base Excess 5.0 (*)    All other components within normal limits  CULTURE, BLOOD (ROUTINE X 2)  CULTURE, BLOOD (ROUTINE X 2)  BASIC METABOLIC PANEL  PRO B NATRIURETIC PEPTIDE  BLOOD GAS, ARTERIAL  SALICYLATE LEVEL  ETHANOL  I-STAT TROPOININ, ED    Imaging Review Ct Angio Chest Pe W/cm &/or Wo Cm  04/06/2014   CLINICAL DATA:  Severe SOB since few hours ago, HTN. Cold and flu symptoms now with sob. Motion due to pt breathing very heavyAnd unable to put arms over head  EXAM: CT ANGIOGRAPHY CHEST  WITH CONTRAST  TECHNIQUE: Multidetector CT imaging of the chest was performed using the standard protocol during bolus administration of intravenous contrast. Multiplanar CT image reconstructions and MIPs were obtained to evaluate the vascular anatomy.  CONTRAST:  OMNIPAQUE IOHEXOL 350 MG/ML SOLN  COMPARISON:  None.  FINDINGS: Study is limited by respiratory motion and less than optimal contrast enhancement of the pulmonary arteries. This limits assessment of the segmental subsegmental vessels.  Allowing for the limitations of this exam, there is no evidence of a large or central pulmonary embolus.  Heart is normal in size. Great vessels normal in caliber. No neck base, axillary, mediastinal or hilar masses or adenopathy.  Lungs are clear.  No pleural effusion.  No pneumothorax.  Evidence of fatty infiltration of the liver. Limited evaluation of the upper abdomen is otherwise unremarkable.  Mild degenerative changes noted of the thoracic spine.  Review of the MIP images confirms the above findings.  IMPRESSION: 1. Somewhat limited exam mostly due to respiratory motion. Allowing for this, no evidence of a central or large pulmonary embolus. 2. Clear lungs.   Electronically Signed   By: Amie Portland M.D.   On: 04/06/2014 21:27   Dg Chest Port 1 View  04/06/2014   CLINICAL DATA:  Short of breath. Cough. History of hypertension and rheumatoid arthritis.  EXAM: PORTABLE CHEST - 1 VIEW  COMPARISON:  02/07/2014.  FINDINGS: The heart size and mediastinal contours are within normal limits. Both lungs are clear. The visualized skeletal structures are unremarkable.  IMPRESSION: No active disease.   Electronically Signed   By: Amie Portland M.D.   On: 04/06/2014 20:51     EKG Interpretation   Date/Time:  Saturday April 06 2014 20:06:04 EST Ventricular Rate:  87 PR Interval:  196 QRS Duration: 72 QT Interval:  348 QTC Calculation: 418 R Axis:   -9 Text Interpretation:  Normal sinus rhythm Normal ECG  Similar to prior  Confirmed by Gwendolyn Grant  MD, Jonni Oelkers ([id number]) on 04/06/2014 9:10:02 PM      MDM   Final diagnoses:  SOB (shortness of breath)  Hyperventilating    50M presents with shortness of breath. Began this morning. URI symptoms for past few days. On initial exam, tachypneic, very labored respirations. Lungs clear without any adventitious sounds. He was mildly somnolent. A quick portable CXR was clear, no PTX. He was taken urgently to PE scan. While awaiting PE scan, ABG returned with respiratory alkalosis. No large A-a gradient, pCO2 low at 25, pO2 350, pH 7.6. EKG ok, no ischemic changes. Patient feeling better after scan, will await labs.  Repeat VBG ok, respiratory alkalosis improved. Patient feeling much better. Will give inhaler for some help. Patient stable for discharge. Instructed to f/u with PCP.    Elwin Mocha, MD 04/07/14 [phone]

## 2014-04-06 NOTE — ED Notes (Signed)
Pt tolerated walk well. sats stayed at 100%

## 2014-04-06 NOTE — ED Notes (Signed)
Dr. Gwendolyn Grant at [facility], pt "feels better", breathing easier, calmer, LS CTA, switched from NRB 15L to venturi mask 35% (4L).

## 2014-04-06 NOTE — ED Notes (Signed)
Patient here with complaint of shortness of breath which woke him from sleep this AM at 0700. Denies history of COPD or Asthma. Has been dealing with cold symptoms for several days prior to now with cough. Breath sounds noted to be diminished to absent on left with clear adequate breath sounds on right. O2 saturation in triage @ 100 with HR 88, breathing labored.

## 2014-04-06 NOTE — ED Notes (Signed)
This RN spoke with pt's daughter [name] [name] in regards to his visit per pt's request. Daughter lives in [place] and requested we call her with any changes in his care. [phone]. Daughter was also given the number to secretary and this RN's phone and encouraged to call at any time.

## 2014-04-06 NOTE — ED Notes (Signed)
Pt resting, NAD, calm, interactive, no changes, "feels better".

## 2014-04-06 NOTE — ED Notes (Signed)
Pt ambulated in h/w, steady gait.

## 2014-04-07 MED ORDER — ALBUTEROL SULFATE HFA 108 (90 BASE) MCG/ACT IN AERS: 1.0000 | INHALATION_SPRAY | Freq: Four times a day (QID) | RESPIRATORY_TRACT | Status: DC | PRN

## 2014-04-07 MED ORDER — ALBUTEROL SULFATE HFA 108 (90 BASE) MCG/ACT IN AERS
2.0000 | INHALATION_SPRAY | Freq: Once | RESPIRATORY_TRACT | Status: AC
  Administered 2014-04-07: 2 via RESPIRATORY_TRACT
  Filled 2014-04-07: qty 6.7

## 2014-04-07 NOTE — Discharge Instructions (Signed)
Hyperventilation °Hyperventilation is breathing that is deeper and more rapid than normal. It is usually associated with panic and anxiety. Hyperventilation can make you feel breathless. It is sometimes called overbreathing. Breathing out too much causes a decrease in the amount of carbon dioxide gas in the blood. This leads to tingling and numbness in the hands, feet, and around the mouth. If this continues, your fingers, hands, and toes may begin to spasm. Hyperventilation usually lasts 20-30 minutes and can be associated with other symptoms of panic and anxiety, including:  °· Chest pains or tightness. °· A pounding or irregular, racing heartbeat (palpitations). °· Dizziness. °· Lightheadedness. °· Dry mouth. °· Weakness. °· Confusion. °· Sleep disturbance. °CAUSES  °Sudden onset (acute) hyperventilation is usually triggered by acute stress, anxiety, or emotional upset. Long-term (chronic) and recurring hyperventilation can occur with chronic lung problems, such emphysema or asthma. Other causes include:  °· Nervousness. °· Stress. °· Stimulant, drug, or alcohol use. °· Lung disease. °· Infections, such as pneumonia. °· Heart problems. °· Severe pain. °· Waking from a bad dream. °· Pregnancy. °· Bleeding. °HOME CARE INSTRUCTIONS °· Learn and use breathing exercises that help you breathe from your diaphragm and abdomen. °· Practice relaxation techniques to reduce stress, such as visualization, meditation, and muscle release. °· During an attack, try breathing into a paper bag. This changes the carbon dioxide level and slows down breathing. °SEEK IMMEDIATE MEDICAL CARE IF: °· Your hyperventilation continues or gets worse. °MAKE SURE YOU: °· Understand these instructions. °· Will watch your condition. °· Will get help right away if you are not doing well or get worse. °Document Released: 05/14/2000 Document Revised: 11/16/2011 Document Reviewed: 08/26/2011 °ExitCare® Patient Information ©2015 [employer]. This  information is not intended to replace advice given to you by your health care provider. Make sure you discuss any questions you have with your health care provider. ° °

## 2014-04-13 LAB — CULTURE, BLOOD (ROUTINE X 2)
CULTURE: NO GROWTH
Culture: NO GROWTH

## 2014-12-12 ENCOUNTER — Ambulatory Visit
Admission: RE | Admit: 2014-12-12 | Discharge: 2014-12-12 | Disposition: A | Discharge status: Home / Self Care | Payer: 59 | Source: Ambulatory Visit | Attending: Internal Medicine | Admitting: Internal Medicine

## 2014-12-12 ENCOUNTER — Other Ambulatory Visit: Payer: Self-pay | Admitting: Internal Medicine

## 2014-12-12 DIAGNOSIS — M25552 Pain in left hip: Secondary | ICD-10-CM

## 2016-04-16 ENCOUNTER — Encounter (HOSPITAL_COMMUNITY): Payer: Self-pay

## 2016-04-16 ENCOUNTER — Encounter (HOSPITAL_COMMUNITY)
Admission: RE | Admit: 2016-04-16 | Discharge: 2016-04-16 | Disposition: A | Discharge status: Home / Self Care | Payer: 59 | Source: Ambulatory Visit | Attending: Orthopedic Surgery | Admitting: Orthopedic Surgery

## 2016-04-16 DIAGNOSIS — Z01818 Encounter for other preprocedural examination: Secondary | ICD-10-CM | POA: Diagnosis present

## 2016-04-16 DIAGNOSIS — Z01812 Encounter for preprocedural laboratory examination: Secondary | ICD-10-CM | POA: Diagnosis not present

## 2016-04-16 DIAGNOSIS — I10 Essential (primary) hypertension: Secondary | ICD-10-CM | POA: Insufficient documentation

## 2016-04-16 DIAGNOSIS — M19011 Primary osteoarthritis, right shoulder: Secondary | ICD-10-CM | POA: Insufficient documentation

## 2016-04-16 HISTORY — DX: Headache, unspecified: R51.9

## 2016-04-16 HISTORY — DX: Dyspnea, unspecified: R06.00

## 2016-04-16 HISTORY — DX: Headache: R51

## 2016-04-16 HISTORY — DX: Unspecified asthma, uncomplicated: J45.909

## 2016-04-16 LAB — CBC
HEMATOCRIT: 37.2 % — AB (ref 39.0–52.0)
Hemoglobin: 12.3 g/dL — ABNORMAL LOW (ref 13.0–17.0)
MCH: 30.4 pg (ref 26.0–34.0)
MCHC: 33.1 g/dL (ref 30.0–36.0)
MCV: 92.1 fL (ref 78.0–100.0)
Platelets: 341 10*3/uL (ref 150–400)
RBC: 4.04 MIL/uL — ABNORMAL LOW (ref 4.22–5.81)
RDW: 12.7 % (ref 11.5–15.5)
WBC: 4.8 10*3/uL (ref 4.0–10.5)

## 2016-04-16 LAB — BASIC METABOLIC PANEL
ANION GAP: 8 (ref 5–15)
BUN: 15 mg/dL (ref 6–20)
CALCIUM: 9 mg/dL (ref 8.9–10.3)
CO2: 27 mmol/L (ref 22–32)
Chloride: 102 mmol/L (ref 101–111)
Creatinine, Ser: 1.08 mg/dL (ref 0.61–1.24)
GFR calc Af Amer: >60 mL/min (ref >60)
GLUCOSE: 89 mg/dL (ref 65–99)
POTASSIUM: 4 mmol/L (ref 3.5–5.1)
SODIUM: 137 mmol/L (ref 135–145)

## 2016-04-16 LAB — SURGICAL PCR SCREEN
MRSA, PCR: NEGATIVE
STAPHYLOCOCCUS AUREUS: NEGATIVE

## 2016-04-16 NOTE — Pre-Procedure Instructions (Signed)
    [name] [name]  04/16/2016      [id number] - [place], [place] - [address] AT [facility] [address] [place] [address] Phone: [phone] Fax: [phone]    Your procedure is scheduled on Thursday, April 29, 2016 at 7:30am  Report to [facility] Admitting at 0530 A.M.  Call this number if you have problems the morning of surgery:  [phone]   Remember:  Do not eat food or drink liquids after midnight.    STOP taking 7 days prior to surgery: Melaxicam (Mobic), any Aspirin or Aspirin-related products, Ibuprofen, Aleve, Advil, Motrin, Goody  Powders, BCs, Naproxen, Herbal Supplements, Fish Oil, or Vitamins.   Do not wear jewelry.  Do not wear lotions, powders, or perfumes, or deoderant.  Men may shave face and neck.  Do not bring valuables to the hospital.  [facility] is not responsible for any belongings or valuables.  Contacts, dentures or bridgework may not be worn into surgery.  Leave your suitcase in the car.  After surgery it may be brought to your room.  For patients admitted to the hospital, discharge time will be determined by your treatment team.  Patients discharged the day of surgery will not be allowed to drive home.   Name and phone number of your driver:  Special instructions:    Please read over the following fact sheets that you were given. Pain Booklet, Coughing and Deep Breathing, Total Joint Packet, MRSA Information and Surgical Site Infection Prevention

## 2016-04-16 NOTE — Progress Notes (Signed)
PCP Andi Devon No Cardiologist on board Did state he had an echo and stress test 2009-2010, never had a heart cath Encouraged patient to speak with surgeon prior to surgery concerning pain control post op Autologous blood transfusion requested if blood products needed,  Informed Velvet at Dr. Rennis Chris office

## 2016-04-28 MED ORDER — DEXTROSE 5 % IV SOLN
3.0000 g | INTRAVENOUS | Status: AC
  Administered 2016-04-29: 3 g via INTRAVENOUS
  Filled 2016-04-28: qty 3000

## 2016-04-28 MED ORDER — TRANEXAMIC ACID 1000 MG/10ML IV SOLN
1000.0000 mg | INTRAVENOUS | Status: AC
  Administered 2016-04-29: 1000 mg via INTRAVENOUS
  Filled 2016-04-28: qty 10

## 2016-04-29 ENCOUNTER — Encounter (HOSPITAL_COMMUNITY)
Admission: RE | Disposition: A | Discharge status: Home / Self Care | Payer: Self-pay | Source: Ambulatory Visit | Attending: Orthopedic Surgery

## 2016-04-29 ENCOUNTER — Observation Stay (HOSPITAL_COMMUNITY)
Admission: RE | Admit: 2016-04-29 | Discharge: 2016-04-30 | Disposition: A | Discharge status: Home / Self Care | Payer: 59 | Source: Ambulatory Visit | Attending: Orthopedic Surgery | Admitting: Orthopedic Surgery

## 2016-04-29 ENCOUNTER — Ambulatory Visit (HOSPITAL_COMMUNITY): Payer: 59 | Admitting: Certified Registered Nurse Anesthetist

## 2016-04-29 ENCOUNTER — Ambulatory Visit (HOSPITAL_COMMUNITY): Payer: 59 | Admitting: Vascular Surgery

## 2016-04-29 ENCOUNTER — Encounter (HOSPITAL_COMMUNITY): Payer: Self-pay | Admitting: *Deleted

## 2016-04-29 DIAGNOSIS — M19011 Primary osteoarthritis, right shoulder: Secondary | ICD-10-CM | POA: Diagnosis present

## 2016-04-29 DIAGNOSIS — J45909 Unspecified asthma, uncomplicated: Secondary | ICD-10-CM | POA: Diagnosis not present

## 2016-04-29 DIAGNOSIS — M069 Rheumatoid arthritis, unspecified: Secondary | ICD-10-CM | POA: Insufficient documentation

## 2016-04-29 DIAGNOSIS — E669 Obesity, unspecified: Secondary | ICD-10-CM | POA: Insufficient documentation

## 2016-04-29 DIAGNOSIS — I10 Essential (primary) hypertension: Secondary | ICD-10-CM | POA: Insufficient documentation

## 2016-04-29 DIAGNOSIS — Z6838 Body mass index (BMI) 38.0-38.9, adult: Secondary | ICD-10-CM | POA: Insufficient documentation

## 2016-04-29 DIAGNOSIS — Z96611 Presence of right artificial shoulder joint: Secondary | ICD-10-CM

## 2016-04-29 HISTORY — PX: TOTAL SHOULDER ARTHROPLASTY: SHX126

## 2016-04-29 SURGERY — ARTHROPLASTY, SHOULDER, TOTAL
Anesthesia: General | Laterality: Right

## 2016-04-29 MED ORDER — DEXAMETHASONE SODIUM PHOSPHATE 10 MG/ML IJ SOLN
INTRAMUSCULAR | Status: DC | PRN
  Administered 2016-04-29: 10 mg via INTRAVENOUS

## 2016-04-29 MED ORDER — PROPOFOL 10 MG/ML IV BOLUS
INTRAVENOUS | Status: AC
  Filled 2016-04-29: qty 20

## 2016-04-29 MED ORDER — FENTANYL CITRATE (PF) 100 MCG/2ML IJ SOLN
INTRAMUSCULAR | Status: DC | PRN
  Administered 2016-04-29: 25 ug via INTRAVENOUS

## 2016-04-29 MED ORDER — OXYCODONE HCL 5 MG PO TABS
5.0000 mg | ORAL_TABLET | ORAL | Status: DC | PRN
  Administered 2016-04-29 – 2016-04-30 (×2): 10 mg via ORAL
  Filled 2016-04-29 (×2): qty 2

## 2016-04-29 MED ORDER — VALSARTAN-HYDROCHLOROTHIAZIDE 320-25 MG PO TABS: 1.0000 | ORAL_TABLET | Freq: Every evening | ORAL | Status: DC

## 2016-04-29 MED ORDER — DEXAMETHASONE SODIUM PHOSPHATE 10 MG/ML IJ SOLN
INTRAMUSCULAR | Status: AC
  Filled 2016-04-29: qty 1

## 2016-04-29 MED ORDER — ACETAMINOPHEN 325 MG PO TABS
650.0000 mg | ORAL_TABLET | Freq: Four times a day (QID) | ORAL | Status: DC | PRN
  Administered 2016-04-29 – 2016-04-30 (×2): 650 mg via ORAL
  Filled 2016-04-29 (×2): qty 2

## 2016-04-29 MED ORDER — SUCCINYLCHOLINE CHLORIDE 200 MG/10ML IV SOSY
PREFILLED_SYRINGE | INTRAVENOUS | Status: AC
  Filled 2016-04-29: qty 10

## 2016-04-29 MED ORDER — PHENYLEPHRINE HCL 10 MG/ML IJ SOLN
INTRAVENOUS | Status: DC | PRN
  Administered 2016-04-29: 25 ug/min via INTRAVENOUS

## 2016-04-29 MED ORDER — ALBUTEROL SULFATE HFA 108 (90 BASE) MCG/ACT IN AERS
INHALATION_SPRAY | RESPIRATORY_TRACT | Status: DC | PRN
  Administered 2016-04-29: 2 via RESPIRATORY_TRACT

## 2016-04-29 MED ORDER — MIDAZOLAM HCL 2 MG/2ML IJ SOLN
INTRAMUSCULAR | Status: AC
  Filled 2016-04-29: qty 2

## 2016-04-29 MED ORDER — PROPOFOL 10 MG/ML IV BOLUS
INTRAVENOUS | Status: DC | PRN
  Administered 2016-04-29: 100 mg via INTRAVENOUS
  Administered 2016-04-29: 50 mg via INTRAVENOUS
  Administered 2016-04-29: 200 mg via INTRAVENOUS

## 2016-04-29 MED ORDER — ALUM & MAG HYDROXIDE-SIMETH 200-200-20 MG/5ML PO SUSP: 30.0000 mL | ORAL | Status: DC | PRN

## 2016-04-29 MED ORDER — SUCCINYLCHOLINE CHLORIDE 20 MG/ML IJ SOLN
INTRAMUSCULAR | Status: DC | PRN
  Administered 2016-04-29: 120 mg via INTRAVENOUS

## 2016-04-29 MED ORDER — HYDROMORPHONE HCL 1 MG/ML IJ SOLN
1.0000 mg | INTRAMUSCULAR | Status: DC | PRN
Start: 2016-04-29 — End: 2016-04-30
  Administered 2016-04-29 – 2016-04-30 (×5): 1 mg via INTRAVENOUS
  Filled 2016-04-29 (×5): qty 1

## 2016-04-29 MED ORDER — METOCLOPRAMIDE HCL 5 MG PO TABS: 5.0000 mg | ORAL_TABLET | Freq: Three times a day (TID) | ORAL | Status: DC | PRN

## 2016-04-29 MED ORDER — ONDANSETRON HCL 4 MG/2ML IJ SOLN
4.0000 mg | Freq: Four times a day (QID) | INTRAMUSCULAR | Status: DC | PRN
  Administered 2016-04-29 – 2016-04-30 (×2): 4 mg via INTRAVENOUS
  Filled 2016-04-29 (×2): qty 2

## 2016-04-29 MED ORDER — DOCUSATE SODIUM 100 MG PO CAPS
100.0000 mg | ORAL_CAPSULE | Freq: Two times a day (BID) | ORAL | Status: DC
Start: 2016-04-29 — End: 2016-04-30
  Administered 2016-04-29 – 2016-04-30 (×3): 100 mg via ORAL
  Filled 2016-04-29 (×3): qty 1

## 2016-04-29 MED ORDER — ONDANSETRON HCL 4 MG/2ML IJ SOLN
INTRAMUSCULAR | Status: AC
  Filled 2016-04-29: qty 2

## 2016-04-29 MED ORDER — PROMETHAZINE HCL 25 MG/ML IJ SOLN: 6.2500 mg | INTRAMUSCULAR | Status: DC | PRN

## 2016-04-29 MED ORDER — FLEET ENEMA 7-19 GM/118ML RE ENEM: 1.0000 | ENEMA | Freq: Once | RECTAL | Status: DC | PRN

## 2016-04-29 MED ORDER — GLYCOPYRROLATE 0.2 MG/ML IJ SOLN
INTRAMUSCULAR | Status: DC | PRN
  Administered 2016-04-29 (×2): 0.1 mg via INTRAVENOUS

## 2016-04-29 MED ORDER — ONDANSETRON HCL 4 MG/2ML IJ SOLN
INTRAMUSCULAR | Status: DC | PRN
  Administered 2016-04-29 (×2): 4 mg via INTRAVENOUS

## 2016-04-29 MED ORDER — LACTATED RINGERS IV SOLN
INTRAVENOUS | Status: DC | PRN
  Administered 2016-04-29 (×2): via INTRAVENOUS

## 2016-04-29 MED ORDER — MIDAZOLAM HCL 2 MG/2ML IJ SOLN
INTRAMUSCULAR | Status: DC | PRN
  Administered 2016-04-29 (×2): 1 mg via INTRAVENOUS

## 2016-04-29 MED ORDER — SILDENAFIL CITRATE 20 MG PO TABS
20.0000 mg | ORAL_TABLET | Freq: Every day | ORAL | Status: DC | PRN
  Filled 2016-04-29: qty 1

## 2016-04-29 MED ORDER — METOPROLOL TARTRATE 5 MG/5ML IV SOLN
INTRAVENOUS | Status: AC
  Filled 2016-04-29: qty 5

## 2016-04-29 MED ORDER — LIDOCAINE 2% (20 MG/ML) 5 ML SYRINGE
INTRAMUSCULAR | Status: AC
  Filled 2016-04-29: qty 5

## 2016-04-29 MED ORDER — FENTANYL CITRATE (PF) 100 MCG/2ML IJ SOLN
INTRAMUSCULAR | Status: AC
  Filled 2016-04-29: qty 2

## 2016-04-29 MED ORDER — CHLORHEXIDINE GLUCONATE 4 % EX LIQD: 60.0000 mL | Freq: Once | CUTANEOUS | Status: DC

## 2016-04-29 MED ORDER — PHENOL 1.4 % MT LIQD: 1.0000 | OROMUCOSAL | Status: DC | PRN

## 2016-04-29 MED ORDER — MENTHOL 3 MG MT LOZG: 1.0000 | LOZENGE | OROMUCOSAL | Status: DC | PRN

## 2016-04-29 MED ORDER — METOCLOPRAMIDE HCL 5 MG/ML IJ SOLN
5.0000 mg | Freq: Three times a day (TID) | INTRAMUSCULAR | Status: DC | PRN
  Administered 2016-04-30: 10 mg via INTRAVENOUS
  Filled 2016-04-29: qty 2

## 2016-04-29 MED ORDER — DIAZEPAM 5 MG PO TABS
2.5000 mg | ORAL_TABLET | Freq: Four times a day (QID) | ORAL | Status: DC | PRN
  Administered 2016-04-30: 5 mg via ORAL
  Filled 2016-04-29: qty 1

## 2016-04-29 MED ORDER — ACETAMINOPHEN 650 MG RE SUPP: 650.0000 mg | Freq: Four times a day (QID) | RECTAL | Status: DC | PRN

## 2016-04-29 MED ORDER — HYDROMORPHONE HCL 1 MG/ML IJ SOLN: 0.2500 mg | INTRAMUSCULAR | Status: DC | PRN

## 2016-04-29 MED ORDER — 0.9 % SODIUM CHLORIDE (POUR BTL) OPTIME
TOPICAL | Status: DC | PRN
  Administered 2016-04-29: 1000 mL

## 2016-04-29 MED ORDER — BUPIVACAINE-EPINEPHRINE (PF) 0.5% -1:200000 IJ SOLN
INTRAMUSCULAR | Status: DC | PRN
  Administered 2016-04-29: 30 mL via PERINEURAL

## 2016-04-29 MED ORDER — BISACODYL 5 MG PO TBEC: 5.0000 mg | DELAYED_RELEASE_TABLET | Freq: Every day | ORAL | Status: DC | PRN

## 2016-04-29 MED ORDER — GLYCOPYRROLATE 0.2 MG/ML IV SOSY
PREFILLED_SYRINGE | INTRAVENOUS | Status: AC
  Filled 2016-04-29: qty 3

## 2016-04-29 MED ORDER — ONDANSETRON HCL 4 MG PO TABS
4.0000 mg | ORAL_TABLET | Freq: Four times a day (QID) | ORAL | Status: DC | PRN
  Filled 2016-04-29: qty 1

## 2016-04-29 MED ORDER — HYDROCHLOROTHIAZIDE 25 MG PO TABS
25.0000 mg | ORAL_TABLET | Freq: Every day | ORAL | Status: DC
  Administered 2016-04-29 – 2016-04-30 (×2): 25 mg via ORAL
  Filled 2016-04-29 (×2): qty 1

## 2016-04-29 MED ORDER — IRBESARTAN 300 MG PO TABS
300.0000 mg | ORAL_TABLET | Freq: Every day | ORAL | Status: DC
  Administered 2016-04-29 – 2016-04-30 (×2): 300 mg via ORAL
  Filled 2016-04-29 (×2): qty 1

## 2016-04-29 MED ORDER — POLYETHYLENE GLYCOL 3350 17 G PO PACK: 17.0000 g | PACK | Freq: Every day | ORAL | Status: DC | PRN

## 2016-04-29 MED ORDER — LIDOCAINE HCL (CARDIAC) 20 MG/ML IV SOLN
INTRAVENOUS | Status: DC | PRN
  Administered 2016-04-29: 100 mg via INTRATRACHEAL

## 2016-04-29 MED ORDER — CEFAZOLIN SODIUM-DEXTROSE 2-4 GM/100ML-% IV SOLN
2.0000 g | Freq: Four times a day (QID) | INTRAVENOUS | Status: AC
  Administered 2016-04-29 – 2016-04-30 (×3): 2 g via INTRAVENOUS
  Filled 2016-04-29 (×3): qty 100

## 2016-04-29 MED ORDER — ARTIFICIAL TEARS OP OINT
TOPICAL_OINTMENT | OPHTHALMIC | Status: AC
  Filled 2016-04-29: qty 3.5

## 2016-04-29 MED ORDER — METOPROLOL TARTARATE 1 MG/ML SYRINGE (5ML)
Status: DC | PRN
  Administered 2016-04-29: 2 mg via INTRAVENOUS
  Administered 2016-04-29: 1 mg via INTRAVENOUS
  Administered 2016-04-29: 2 mg via INTRAVENOUS

## 2016-04-29 MED ORDER — DIPHENHYDRAMINE HCL 12.5 MG/5ML PO ELIX
12.5000 mg | ORAL_SOLUTION | ORAL | Status: DC | PRN
  Administered 2016-04-29: 12.5 mg via ORAL
  Filled 2016-04-29: qty 10

## 2016-04-29 MED ORDER — KETAMINE HCL-SODIUM CHLORIDE 100-0.9 MG/10ML-% IV SOSY
PREFILLED_SYRINGE | INTRAVENOUS | Status: AC
  Filled 2016-04-29: qty 10

## 2016-04-29 MED ORDER — KETAMINE HCL 10 MG/ML IJ SOLN
INTRAMUSCULAR | Status: DC | PRN
  Administered 2016-04-29 (×2): 20 mg via INTRAVENOUS

## 2016-04-29 MED ORDER — LACTATED RINGERS IV SOLN: INTRAVENOUS | Status: DC

## 2016-04-29 SURGICAL SUPPLY — 67 items
ADH SKN CLS APL DERMABOND .7 (GAUZE/BANDAGES/DRESSINGS) ×1
ADH SKN CLS LQ APL DERMABOND (GAUZE/BANDAGES/DRESSINGS) ×1
AID PSTN UNV HD RSTRNT DISP (MISCELLANEOUS) ×1
BLADE SAW SGTL 83.5X18.5 (BLADE) ×2 IMPLANT
CEMENT BONE DEPUY (Cement) ×2 IMPLANT
COVER SURGICAL LIGHT HANDLE (MISCELLANEOUS) ×2 IMPLANT
DERMABOND ADHESIVE PROPEN (GAUZE/BANDAGES/DRESSINGS) ×1
DERMABOND ADVANCED (GAUZE/BANDAGES/DRESSINGS) ×1
DERMABOND ADVANCED .7 DNX12 (GAUZE/BANDAGES/DRESSINGS) IMPLANT
DERMABOND ADVANCED .7 DNX6 (GAUZE/BANDAGES/DRESSINGS) ×1 IMPLANT
DRAPE ORTHO SPLIT 77X108 STRL (DRAPES) ×4
DRAPE SURG 17X11 SM STRL (DRAPES) ×2 IMPLANT
DRAPE SURG ORHT 6 SPLT 77X108 (DRAPES) ×2 IMPLANT
DRAPE U-SHAPE 47X51 STRL (DRAPES) ×2 IMPLANT
DRESSING AQUACEL AG SP 3.5X10 (GAUZE/BANDAGES/DRESSINGS) IMPLANT
DRSG AQUACEL AG ADV 3.5X10 (GAUZE/BANDAGES/DRESSINGS) ×2 IMPLANT
DRSG AQUACEL AG SP 3.5X10 (GAUZE/BANDAGES/DRESSINGS) ×2
DURAPREP 26ML APPLICATOR (WOUND CARE) ×2 IMPLANT
ELECT BLADE 4.0 EZ CLEAN MEGAD (MISCELLANEOUS) ×2
ELECT CAUTERY BLADE 6.4 (BLADE) ×2 IMPLANT
ELECT REM PT RETURN 9FT ADLT (ELECTROSURGICAL) ×2
ELECTRODE BLDE 4.0 EZ CLN MEGD (MISCELLANEOUS) ×1 IMPLANT
ELECTRODE REM PT RTRN 9FT ADLT (ELECTROSURGICAL) ×1 IMPLANT
FACESHIELD WRAPAROUND (MASK) ×4 IMPLANT
FACESHIELD WRAPAROUND OR TEAM (MASK) ×3 IMPLANT
GLENOID WITH CLEAT MEDIUM (Shoulder) ×1 IMPLANT
GLOVE BIO SURGEON STRL SZ7.5 (GLOVE) ×2 IMPLANT
GLOVE BIO SURGEON STRL SZ8 (GLOVE) ×2 IMPLANT
GLOVE EUDERMIC 7 POWDERFREE (GLOVE) ×2 IMPLANT
GLOVE SS BIOGEL STRL SZ 7.5 (GLOVE) ×1 IMPLANT
GLOVE SUPERSENSE BIOGEL SZ 7.5 (GLOVE) ×1
GOWN STRL REUS W/ TWL LRG LVL3 (GOWN DISPOSABLE) ×1 IMPLANT
GOWN STRL REUS W/ TWL XL LVL3 (GOWN DISPOSABLE) ×2 IMPLANT
GOWN STRL REUS W/TWL LRG LVL3 (GOWN DISPOSABLE) ×2
GOWN STRL REUS W/TWL XL LVL3 (GOWN DISPOSABLE) ×4
HEAD HUMERAL UNIVERS II 50/19 (Head) ×1 IMPLANT
KIT BASIN OR (CUSTOM PROCEDURE TRAY) ×2 IMPLANT
KIT ROOM TURNOVER OR (KITS) ×2 IMPLANT
KIT SET UNIVERSAL (KITS) ×1 IMPLANT
MANIFOLD NEPTUNE II (INSTRUMENTS) ×2 IMPLANT
NDL SUT .5 MAYO 1.404X.05X (NEEDLE) ×1 IMPLANT
NDL SUT 6 .5 CRC .975X.05 MAYO (NEEDLE) ×1 IMPLANT
NEEDLE MAYO TAPER (NEEDLE) ×4
NS IRRIG 1000ML POUR BTL (IV SOLUTION) ×2 IMPLANT
PACK SHOULDER (CUSTOM PROCEDURE TRAY) ×2 IMPLANT
PAD ARMBOARD 7.5X6 YLW CONV (MISCELLANEOUS) ×4 IMPLANT
RESTRAINT HEAD UNIVERSAL NS (MISCELLANEOUS) ×2 IMPLANT
SLING ARM FOAM STRAP LRG (SOFTGOODS) IMPLANT
SLING ARM FOAM STRAP XLG (SOFTGOODS) ×1 IMPLANT
SLING ARM XL FOAM STRAP (SOFTGOODS) ×2 IMPLANT
SMARTMIX MINI TOWER (MISCELLANEOUS) ×2
SPONGE LAP 18X18 X RAY DECT (DISPOSABLE) ×2 IMPLANT
SPONGE LAP 4X18 X RAY DECT (DISPOSABLE) ×2 IMPLANT
STEM HUMERAL UNI APEX 10MM (Shoulder) ×1 IMPLANT
SUCTION FRAZIER HANDLE 10FR (MISCELLANEOUS) ×1
SUCTION TUBE FRAZIER 10FR DISP (MISCELLANEOUS) ×1 IMPLANT
SUT FIBERWIRE #2 38 T-5 BLUE (SUTURE) ×10
SUT MNCRL AB 3-0 PS2 18 (SUTURE) ×2 IMPLANT
SUT MON AB 2-0 CT1 36 (SUTURE) ×2 IMPLANT
SUT VIC AB 1 CT1 27 (SUTURE) ×2
SUT VIC AB 1 CT1 27XBRD ANBCTR (SUTURE) ×1 IMPLANT
SUTURE FIBERWR #2 38 T-5 BLUE (SUTURE) ×4 IMPLANT
SYR CONTROL 10ML LL (SYRINGE) IMPLANT
TOWEL OR 17X24 6PK STRL BLUE (TOWEL DISPOSABLE) ×2 IMPLANT
TOWEL OR 17X26 10 PK STRL BLUE (TOWEL DISPOSABLE) ×2 IMPLANT
TOWER SMARTMIX MINI (MISCELLANEOUS) ×1 IMPLANT
WATER STERILE IRR 1000ML POUR (IV SOLUTION) ×1 IMPLANT

## 2016-04-29 NOTE — H&P (Signed)
Raekwon L Stancil    Chief Complaint: RIGHT SHOULDER OA HPI: The patient is a 60 y.o. male with end stage right shoulder OA  Past Medical History:  Diagnosis Date  . Asthma    as a child  . Dyspnea    while resting at night  . Headache   . Hypertension   . Rheumatoid arthritis without rheumatoid factor     Past Surgical History:  Procedure Laterality Date  . KNEE REPLACED Bilateral 02/2009  . tarsal exostectomy right foot      Family History  Problem Relation Age of Onset  . Cancer Mother   . Rheum arthritis Mother     Social History:  reports that he has never smoked. He has never used smokeless tobacco. He reports that he drinks about 1.2 oz of alcohol per week . He reports that he does not use drugs.   Medications Prior to Admission  Medication Sig Dispense Refill  . Ascorbic Acid (VITAMIN C) 1000 MG tablet Take 1,000 mg by mouth daily.    . Aspirin-Caffeine (BAYER BACK & BODY) 500-32.5 MG TABS Take 2 tablets by mouth daily as needed (for back pain).    . Cats Claw 500 MG CAPS Take 500 mg by mouth daily.    . Cholecalciferol (VITAMIN D-3) 5000 units TABS Take 5,000 Units by mouth every 3 (three) days.    . Cyanocobalamin (VITAMIN B-12) 2500 MCG SUBL Place 2,500 mcg under the tongue every other day.    . Ferrous Sulfate (IRON) 325 (65 Fe) MG TABS Take 65 mg by mouth every other day.    . lidocaine (LIDODERM) 5 % Place 1 patch onto the skin daily as needed (for back pain). Remove & Discard patch within 12 hours or as directed by MD    . meloxicam (MOBIC) 15 MG tablet Take 15 mg by mouth daily.  2  . sildenafil (REVATIO) 20 MG tablet Take 20 mg by mouth daily as needed for wheezing.  3  . valsartan-hydrochlorothiazide (DIOVAN-HCT) 320-25 MG per tablet Take 1 tablet by mouth every evening.     . vitamin E 400 UNIT capsule Take 400 Units by mouth 2 (two) times a week.       Physical Exam: right shoulder with painful and restricted motion as noted at recent office  visits  Vitals  Temp:  [98.6 F (37 C)] 98.6 F (37 C) (11/30 0617) Pulse Rate:  [79] 79 (11/30 0617) Resp:  [0] 0 (11/30 0617) BP: (179)/(104) 179/104 (11/30 0617) SpO2:  [100 %] 100 % (11/30 0617)  Assessment/Plan  Impression: RIGHT SHOULDER OA  Plan of Action: Procedure(s): TOTAL SHOULDER ARTHROPLASTY  Efstathios Sawin M Dilraj Killgore 04/29/2016, 7:22 AM Contact # [phone]

## 2016-04-29 NOTE — Discharge Instructions (Signed)

## 2016-04-29 NOTE — Anesthesia Procedure Notes (Signed)
Procedure Name: Intubation Date/Time: 04/29/2016 7:44 AM Performed by: Sharee Holster Pre-anesthesia Checklist: Patient identified, Emergency Drugs available, Suction available and Patient being monitored Patient Re-evaluated:Patient Re-evaluated prior to inductionOxygen Delivery Method: Circle system utilized Preoxygenation: Pre-oxygenation with 100% oxygen Intubation Type: IV induction Laryngoscope Size: Mac and 3 Grade View: Grade II Tube type: Oral Tube size: 7.0 mm Number of attempts: 1 Airway Equipment and Method: Stylet Placement Confirmation: ETT inserted through vocal cords under direct vision,  positive ETCO2 and breath sounds checked- equal and bilateral Secured at: 23 cm Tube secured with: Tape Dental Injury: Teeth and Oropharynx as per pre-operative assessment

## 2016-04-29 NOTE — Op Note (Signed)
04/29/2016  9:31 AM  PATIENT:   [name] [name]  60 y.o. male  PRE-OPERATIVE DIAGNOSIS:  RIGHT SHOULDER OA  POST-OPERATIVE DIAGNOSIS:  same  PROCEDURE:  R TSA #10 stem, 50x19 head, medium glenoid  SURGEON:  Eathon Valade, Vania Rea. M.D.  ASSISTANTS: Shuford pac   ANESTHESIA:   GET + ISB  EBL: 150  SPECIMEN:  none  Drains: none   PATIENT DISPOSITION:  PACU - hemodynamically stable.    PLAN OF CARE: Admit for overnight observation  Dictation# ???    Contact # [phone]

## 2016-04-29 NOTE — Anesthesia Postprocedure Evaluation (Signed)
Anesthesia Post Note  Patient: [name] [name]  Procedure(s) Performed: Procedure(s) (LRB): TOTAL SHOULDER ARTHROPLASTY (Right)  Patient location during evaluation: PACU Anesthesia Type: General Level of consciousness: awake and alert Pain management: pain level controlled Vital Signs Assessment: post-procedure vital signs reviewed and stable Respiratory status: spontaneous breathing, nonlabored ventilation, respiratory function stable and patient connected to nasal cannula oxygen Cardiovascular status: blood pressure returned to baseline and stable Postop Assessment: no signs of nausea or vomiting Anesthetic complications: no    Last Vitals:  Vitals:   04/29/16 1200 04/29/16 1231  BP:  (!) 159/91  Pulse: 89 81  Resp: 15 20  Temp:  36.5 C    Last Pain:  Vitals:   04/29/16 1115  TempSrc:   PainSc: 0-No pain                 Larue Lightner,JAMES TERRILL

## 2016-04-29 NOTE — Transfer of Care (Signed)
Immediate Anesthesia Transfer of Care Note  Patient: [name] [name]  Procedure(s) Performed: Procedure(s): TOTAL SHOULDER ARTHROPLASTY (Right)  Patient Location: PACU  Anesthesia Type:General and Regional  Level of Consciousness: awake, alert  and patient cooperative  Airway & Oxygen Therapy: Patient Spontanous Breathing and Patient connected to face mask oxygen  Post-op Assessment: Report given to RN, Post -op Vital signs reviewed and stable, Patient moving all extremities X 4 and Patient able to stick tongue midline  Post vital signs: Reviewed and stable  Last Vitals:  Vitals:   04/29/16 0617 04/29/16 0950  BP: (!) 179/104 (!) (P) 146/91  Pulse: 79   Resp: (!) 0 (P) 14  Temp: 37 C (P) 36.6 C    Last Pain:  Vitals:   04/29/16 0617  TempSrc: Oral  PainSc:       Patients Stated Pain Goal: 5 (04/29/16 0555)  Complications: No apparent anesthesia complications

## 2016-04-29 NOTE — Anesthesia Procedure Notes (Addendum)
Anesthesia Regional Block:  Interscalene brachial plexus block  Pre-Anesthetic Checklist: ,, timeout performed, Correct Patient, Correct Site, Correct Laterality, Correct Procedure, Correct Position, site marked, Risks and benefits discussed, at surgeon's request and post-op pain management  Laterality: Upper and Right  Prep: chloraprep, alcohol swabs       Needles:  Injection technique: Single-shot  Needle Type: Echogenic Needle     Needle Length: 9cm 9 cm Needle Gauge: 22 and 22 G  Needle insertion depth: 4 cm   Additional Needles:  Procedures: ultrasound guided (picture in chart) and nerve stimulator Interscalene brachial plexus block  Nerve Stimulator or Paresthesia:  Response: Twitch elicited, 0.5 mA, 0.3 ms,   Additional Responses:   Narrative:  Start time: 04/29/2016 7:00 AM End time: 04/29/2016 7:13 AM Injection made incrementally with aspirations every 5 mL.  Performed by: Personally  Anesthesiologist: Jaci Desanto  Additional Notes: Block assessed prior to start of surgery

## 2016-04-29 NOTE — Anesthesia Preprocedure Evaluation (Addendum)
Anesthesia Evaluation  Patient identified by MRN, date of birth, ID band Patient awake    Reviewed: Allergy & Precautions, NPO status , Patient's Chart, lab work & pertinent test results  Airway Mallampati: II  TM Distance: >3 FB Neck ROM: Full    Dental  (+) Teeth Intact   Pulmonary shortness of breath, asthma ,    breath sounds clear to auscultation       Cardiovascular hypertension,  Rhythm:Regular Rate:Normal     Neuro/Psych    GI/Hepatic negative GI ROS, Neg liver ROS,   Endo/Other  negative endocrine ROS  Renal/GU negative Renal ROS     Musculoskeletal  (+) Arthritis ,   Abdominal (+) + obese,   Peds  Hematology   Anesthesia Other Findings   Reproductive/Obstetrics                            Anesthesia Physical Anesthesia Plan  ASA: III  Anesthesia Plan: General   Post-op Pain Management:  Regional for Post-op pain   Induction: Intravenous  Airway Management Planned:   Additional Equipment:   Intra-op Plan:   Post-operative Plan: Extubation in OR  Informed Consent: I have reviewed the patients History and Physical, chart, labs and discussed the procedure including the risks, benefits and alternatives for the proposed anesthesia with the patient or authorized representative who has indicated his/her understanding and acceptance.     Plan Discussed with:   Anesthesia Plan Comments:         Anesthesia Quick Evaluation

## 2016-04-30 ENCOUNTER — Encounter (HOSPITAL_COMMUNITY): Payer: Self-pay | Admitting: Orthopedic Surgery

## 2016-04-30 DIAGNOSIS — M19011 Primary osteoarthritis, right shoulder: Secondary | ICD-10-CM | POA: Diagnosis not present

## 2016-04-30 MED ORDER — HYDROMORPHONE HCL 2 MG PO TABS: 2.0000 mg | ORAL_TABLET | ORAL | 0 refills | Status: AC | PRN

## 2016-04-30 MED ORDER — ONDANSETRON HCL 4 MG PO TABS: 4.0000 mg | ORAL_TABLET | Freq: Three times a day (TID) | ORAL | 0 refills | Status: DC | PRN

## 2016-04-30 MED ORDER — OXYCODONE-ACETAMINOPHEN 5-325 MG PO TABS: 1.0000 | ORAL_TABLET | ORAL | 0 refills | Status: DC | PRN

## 2016-04-30 MED ORDER — DIAZEPAM 5 MG PO TABS: 2.5000 mg | ORAL_TABLET | Freq: Four times a day (QID) | ORAL | 1 refills | Status: DC | PRN

## 2016-04-30 NOTE — Op Note (Signed)
NAME:  [name] [name], [name] [name] NO.:  [id number]  MEDICAL RECORD NO.:  [id number]  LOCATION:  SDS                          FACILITY:  [facility]  PHYSICIAN:  Vania Rea. Krista Godsil, M.D.  DATE OF BIRTH:  [birth year]  DATE OF PROCEDURE:  04/29/2016 DATE OF DISCHARGE:                              OPERATIVE REPORT   PREOPERATIVE DIAGNOSIS:  End-stage right shoulder osteoarthritis.  POSTOPERATIVE DIAGNOSIS:  End-stage right shoulder osteoarthritis.  PROCEDURES:  Right total shoulder arthroplasty utilizing a press-fit size 10 Arthrex stem with a 50 x 19 eccentric head and a medium glenoid.  SURGEON:  Vania Rea. Jaquelyne Firkus, M.D.  Threasa HeadsFrench Ana A. Shuford, P.A.-C.  ANESTHESIA:  General endotracheal as well as interscalene block.  ESTIMATED BLOOD LOSS:  150 mL.  DRAINS:  None.  HISTORY:  [name] [name] is a 60 year old gentleman, who has had chronic and progressive increasing right shoulder pain related to end-stage osteoarthritis.  Due to his increasing pain and functional limitations, he is brought to the operating room at this time for planned right total shoulder arthroplasty.  Preoperatively, I counseled [name] [name] regarding treatment options and potential risks versus benefits thereof.  Possible surgical complications were reviewed including bleeding, infection, neurovascular injury, persistent pain, loss of motion, anesthetic complication, and possible need for additional surgery.  He understands and accepts and agrees with our planned procedure.  PROCEDURE IN DETAIL:  After undergoing routine preop evaluation, the patient received prophylactic antibiotics, and an interscalene block was established in the holding area by the Anesthesia Department.  Placed supine on the operating table, underwent smooth induction of a general endotracheal anesthesia.  Placed in the beach chair position and appropriately padded and protected.  The right shoulder girdle region was then sterilely  prepped and draped in standard fashion.  Time-out was called.  An anterior deltopectoral approach to the right shoulder was made through an approximately 8 cm incision.  Skin flaps were elevated, and electrocautery was used for hemostasis.  The dissection was carried deeply.  Cephalic vein taken laterally with the deltoid.  The interval was developed bluntly from proximal to distal and then the upper centimeter and a half of the pec major was tenotomized to enhance exposure.  We divided adhesions beneath the deltoid and then a large accumulation of fluid within the subacromial/subdeltoid bursal region which we evacuated.  The conjoined tendon was then mobilized and retracted medially.  At this point, we then unroofed the long-head biceps tendon, this was tenotomized for later tenodesis, and then we split the rotator cuff along the rotator interval from the bicipital groove to the base of the coracoid and then separated the subscapularis away from the lesser tuberosity, leaving approximately 1 cm cuff for later repair.  Free margin was tagged with FiberWire sutures using a grasping locking technique.  We then divided the capsular attachments from the anteroinferior and inferior aspects of the humeral neck and delivered the humeral head through the wound.  We then carefully protected the rotator cuff, outlined our proposed humeral head resection with the extramedullary guide, then performed the resection with an oscillating saw, carefully protecting the surrounding soft tissues.  We then removed the osteophytes on the  anterior and inferior aspects of the humeral neck.  The humeral canal was then hand reamed up to size 7 and broached up to size 10 maintaining the native retroversion of approximately 30 degrees.  At this point, we then placed a size 9 trial with metal cap to protect the metaphysis of the proximal humerus.  At this point, we then exposed the glenoid with [place], pitchfork,  and snake tongue retractors.  I performed a resection of the labrum circumferentially to gain complete visualization of the glenoid, placed a guidepin into the center after we had measured for a medium and used the medium reamer to ream to a stable subchondral bony bed and removed all debris and soft tissue from the margins of the glenoid.  Also thoroughly mobilized the subscapularis circumferentially.  At this point, we then drilled our central drill hole for the glenoid, followed by the superior and inferior PEG and slot respectively, broached the glenoid, then trial showed excellent fit.  The glenoid was irrigated, cleaned, and dried.  Cement was introduced into the superior and inferior PEG and slot respectively.  Then, we impacted the final glenoid with excellent fit and fixation.  At this point, we then returned our attention back to the proximal humerus where we cleaned the canal, we inserted the size 10 stem with excellent interference fit, and then performed locking of the proximal locking screws.  We then performed a series of trial reductions with the 50 x 19 head showing excellent soft tissue balance with 50% translation of the humeral head and the glenoid. This final size 50 was impacted on the [facility] taper after being carefully cleaned and dried.  Final reduction was then performed, and the subscapularis was repaired back to the lesser tuberosity through the cuff of soft tissues using the #2 FiberWires in a horizontal mattress pattern and then repaired the rotator cuff with the rotator interval with a series of 3 figure-of-eight FiberWires and the overall soft tissue balance was much to our satisfaction.  We easily achieved 45 degrees of external rotation with the arm at the side.  We then tenodesed the subscapularis at the upper border of the pec major.  The wound was then copiously irrigated.  Hemostasis was obtained.  The deltopectoral interval was then reapproximated with  a series of figure- of-eight #1 Vicryl sutures.  2-0 Monocryl used for the subcu layer, intracuticular 3-0 Monocryl for the skin, followed by Dermabond, Aquacel dressing.  The right arm was placed in a sling.  The patient was awakened, extubated, and taken to the recovery room in stable condition.  Ralene Bathe, PA-C, was used as an Geophysicist/field seismologist throughout this case, essential for help with positioning the patient, positioning the extremity, management of the tissue retractors, implantation of the prosthesis, wound closure, and intraoperative decision making.     Vania Rea. Giordana Weinheimer, M.D.     KMS/MEDQ  D:  04/29/2016  T:  04/30/2016  Job:  [id number]

## 2016-04-30 NOTE — Progress Notes (Signed)
Pt doing well. Pt and wife given D/C instructions with Rx's, verbal understanding was provided. Pt's incision is clean and dry with no sign of infection. Pt's IV was removed prior to D/C. Pt D/C'd home via wheelchair @ 1635 per MD order. Pt is stable @ D/C and has no other needs at this time. Rema Fendt, RN

## 2016-04-30 NOTE — Progress Notes (Signed)
Occupational Therapy Treatment Patient Details Name: [name] [name] MRN: [id number] DOB: Feb 23, 1956 Today's Date: 04/30/2016    History of present illness 60 yo male s/p R TSA   Past Medical History:  Diagnosis Date  . Asthma    as a child  . Dyspnea    while resting at night  . Headache   . Hypertension   . Rheumatoid arthritis without rheumatoid factor       OT comments  Second session much improved with pain under control, ice applied to shoulder until return of therapist and decr nausea. Pt more alert and responsive. Pt thanking therapist for returning and giving time for medication to take affect. Pt able to complete bed level exercises x3, static standing pendulum exercises and don doff sling with wife. No further questions. All education is complete and patient indicates understanding.   Follow Up Recommendations  No OT follow up;Other (comment)    Equipment Recommendations  None recommended by OT    Recommendations for Other Services      Precautions / Restrictions Precautions Precautions: Shoulder Type of Shoulder Precautions: Passive Shoulder Interventions: Shoulder sling/immobilizer;Off for dressing/bathing/exercises Precaution Comments: handout for shoulder d/c sheet, pendulums and PROM FF ER ABDUCTION provided Required Braces or Orthoses: Sling Restrictions Weight Bearing Restrictions: Yes RUE Weight Bearing: Non weight bearing       Mobility Bed Mobility Overal bed mobility: Needs Assistance Bed Mobility: Supine to Sit     Supine to sit: Min assist     General bed mobility comments: pt needed (A) to elevate trunk from bed surface even with HOB elevated. pt plans to sleep in recliner at dc/ Encoouraged to elevate surface with pillows and blankets  Transfers Overall transfer level: Needs assistance   Transfers: Sit to/from Stand Sit to Stand: Mod assist         General transfer comment: (A) to elevated adn uncontrolled descend. Pt needs  elevated surfaces for all sitting     Balance                                   ADL Overall ADL's : Needs assistance/impaired Eating/Feeding: Modified independent   Grooming: Wash/dry face;Supervision/safety Grooming Details (indicate cue type and reason): wife (A)ing with wiping face at this time due to sweating Upper Body Bathing: Minimal assitance;Sitting   Lower Body Bathing: Moderate assistance   Upper Body Dressing : Moderate assistance       Toilet Transfer: Moderate assistance Toilet Transfer Details (indicate cue type and reason): pt requires (A) with L UE to power up into standing. pt demonstrates decr ability to stand without UE use         Functional mobility during ADLs: Min guard General ADL Comments: Completed shoulder exercises supine and then static standing for pendulums.       Vision                     Perception     Praxis      Cognition   Behavior During Therapy: WFL for tasks assessed/performed Overall Cognitive Status: Within Functional Limits for tasks assessed                       Extremity/Trunk Assessment  Upper Extremity Assessment Upper Extremity Assessment: RUE deficits/detail;LUE deficits/detail RUE Deficits / Details: s/p surg LUE Deficits / Details: able to complete shoulder flexion to 100 degrees and  reach R elbow active movement   Lower Extremity Assessment Lower Extremity Assessment: Overall WFL for tasks assessed   Cervical / Trunk Assessment Cervical / Trunk Assessment: Normal    Exercises Shoulder Exercises Pendulum Exercise: PROM;Right;10 reps;Standing Shoulder Flexion: PROM;10 reps;Supine Shoulder ABduction: PROM;Supine;10 reps Wrist Flexion: AROM;10 reps;Right Digit Composite Flexion: AROM;10 reps;Right Donning/doffing shirt without moving shoulder: Patient able to independently direct caregiver;Modified independent Method for sponge bathing under operated UE: Patient able to  independently direct caregiver Donning/doffing sling/immobilizer: Modified independent;Patient able to independently direct caregiver Correct positioning of sling/immobilizer: Patient able to independently direct caregiver Pendulum exercises (written home exercise program): Patient able to independently direct caregiver ROM for elbow, wrist and digits of operated UE: Caregiver independent with task Sling wearing schedule (on at all times/off for ADL's): Caregiver independent with task;Patient able to independently direct caregiver Proper positioning of operated UE when showering: Patient able to independently direct caregiver;Caregiver independent with task Dressing change: Caregiver independent with task;Patient able to independently direct caregiver Positioning of UE while sleeping: Patient able to independently direct caregiver;Caregiver independent with task   Shoulder Instructions Shoulder Instructions Donning/doffing shirt without moving shoulder: Patient able to independently direct caregiver;Modified independent Method for sponge bathing under operated UE: Patient able to independently direct caregiver Donning/doffing sling/immobilizer: Modified independent;Patient able to independently direct caregiver Correct positioning of sling/immobilizer: Patient able to independently direct caregiver Pendulum exercises (written home exercise program): Patient able to independently direct caregiver ROM for elbow, wrist and digits of operated UE: Caregiver independent with task Sling wearing schedule (on at all times/off for ADL's): Caregiver independent with task;Patient able to independently direct caregiver Proper positioning of operated UE when showering: Patient able to independently direct caregiver;Caregiver independent with task Dressing change: Caregiver independent with task;Patient able to independently direct caregiver Positioning of UE while sleeping: Patient able to independently direct  caregiver;Caregiver independent with task     General Comments      Pertinent Vitals/ Pain       Pain Assessment: Faces Pain Score: 8  Faces Pain Scale: Hurts little more Pain Location: shoulder R Pain Descriptors / Indicators: Discomfort Pain Intervention(s): Premedicated before session;Repositioned;Ice applied  Home Living Family/patient expects to be discharged to:: Private residence Living Arrangements: Spouse/significant other Available Help at Discharge: Family Type of Home: House Home Access: Stairs to enter [employer] of Steps: 9 (2 in the back of the house) Entrance Stairs-Rails: None Home Layout: 1/2 bath on main level;Bed/bath upstairs;Able to live on main level with bedroom/bathroom     Bathroom Shower/Tub: Producer, television/film/video: Standard     Home Equipment: None   Additional Comments: pt plans to sleep on the first floor in recliner and sponge bath in half bath      Prior Functioning/Environment Level of Independence: Independent            Frequency  Min 3X/week        Progress Toward Goals  OT Goals(current goals can now be found in the care plan section)  Progress towards OT goals: Progressing toward goals  Acute Rehab OT Goals Patient Stated Goal: none stated at this time OT Goal Formulation: With patient/family Time For Goal Achievement: 05/14/16 Potential to Achieve Goals: Good ADL Goals Additional ADL Goal #2: Pt will complete PROM exercises supine in bed with handout mod I  Plan Discharge plan remains appropriate    Co-evaluation                 End of Session Equipment  Utilized During Treatment: Other (comment) (slingq)   Activity Tolerance Patient tolerated treatment well   Patient Left in bed;with call bell/phone within reach;with family/visitor present   Nurse Communication Mobility status;Precautions    Functional Assessment Tool Used: clinical judgement Functional Limitation: Self  care Self Care Current Status (X9371): At least 60 percent but less than 80 percent impaired, limited or restricted Self Care Goal Status (I9678): At least 20 percent but less than 40 percent impaired, limited or restricted   Time: 1011-1038 OT Time Calculation (min): 27 min  Charges: OT G-codes **NOT FOR INPATIENT CLASS** Functional Assessment Tool Used: clinical judgement Functional Limitation: Self care Self Care Current Status (L3810): At least 60 percent but less than 80 percent impaired, limited or restricted Self Care Goal Status (F7510): At least 20 percent but less than 40 percent impaired, limited or restricted OT General Charges $OT Visit: 1 Procedure OT Evaluation $OT Eval Moderate Complexity: 1 Procedure OT Treatments $Self Care/Home Management : 23-37 mins  Harolyn Rutherford 04/30/2016, 11:13 AM   Mateo Flow   OTR/L Pager: [phone] Office: [phone] .

## 2016-04-30 NOTE — Discharge Summary (Signed)
PATIENT ID:      [name] [name]  MRN:     [id number] DOB/AGE:    [birth year] / 60 y.o.     DISCHARGE SUMMARY  ADMISSION DATE:    04/29/2016 DISCHARGE DATE:    ADMISSION DIAGNOSIS: RIGHT SHOULDER OA Past Medical History:  Diagnosis Date  . Asthma    as a child  . Dyspnea    while resting at night  . Headache   . Hypertension   . Rheumatoid arthritis without rheumatoid factor     DISCHARGE DIAGNOSIS:   Active Problems:   S/P shoulder replacement, right   PROCEDURE: Procedure(s): TOTAL SHOULDER ARTHROPLASTY on 04/29/2016  CONSULTS:    HISTORY:  See H&P in chart.  HOSPITAL COURSE:  [name] [name] is a 60 y.o. admitted on 04/29/2016 with a diagnosis of RIGHT SHOULDER OA.  They were brought to the operating room on 04/29/2016 and underwent Procedure(s): TOTAL SHOULDER ARTHROPLASTY.    They were given perioperative antibiotics: Anti-infectives    Start     Dose/Rate Route Frequency Ordered Stop   04/29/16 1400  ceFAZolin (ANCEF) IVPB 2g/100 mL premix     2 g 200 mL/hr over 30 Minutes Intravenous Every 6 hours 04/29/16 1231 04/30/16 0156   04/29/16 0600  ceFAZolin (ANCEF) 3 g in dextrose 5 % 50 mL IVPB     3 g 130 mL/hr over 30 Minutes Intravenous On call to O.R. 04/28/16 1311 04/29/16 0750    .  Patient underwent the above named procedure and tolerated it well. The following day they were hemodynamically stable and pain was controlled on oral analgesics. Some additional nausea was encountered and controlled with zofran. Patient had history of post op N/V, fluids were encouraged and he was tolerating po's. They were neurovascularly intact to the operative extremity. OT was ordered and worked with patient per protocol. They were medically and orthopaedically stable for discharge on day 1.    DIAGNOSTIC STUDIES:  RECENT RADIOGRAPHIC STUDIES :  No results found.  RECENT VITAL SIGNS:  Patient Vitals for the past 24 hrs:  BP Temp Temp src Pulse Resp SpO2  04/30/16 0400 122/85  98.8 F (37.1 C) Oral (!) 105 (!) 105 95 %  04/29/16 2342 (!) 144/91 97.6 F (36.4 C) Oral 100 20 99 %  04/29/16 1952 (!) 168/103 98.8 F (37.1 C) Oral 80 20 96 %  04/29/16 1737 (!) 165/111 98.2 F (36.8 C) - (!) 104 20 98 %  04/29/16 1231 (!) 159/91 97.7 F (36.5 C) - 81 20 95 %  04/29/16 1200 - - - 89 15 99 %  04/29/16 1145 - - - 87 13 99 %  04/29/16 1130 (!) 146/92 - - 89 15 99 %  04/29/16 1115 (!) 156/91 97.8 F (36.6 C) - 85 13 99 %  04/29/16 1100 (!) 155/89 - - 85 14 100 %  04/29/16 1045 (!) 154/87 - - 86 13 100 %  04/29/16 1030 (!) 150/91 - - 89 14 100 %  04/29/16 1015 (!) 148/92 - - 89 14 100 %  04/29/16 1000 (!) 142/84 - - 92 12 100 %  04/29/16 0950 (!) 146/91 97.9 F (36.6 C) - 99 14 100 %  .  RECENT EKG RESULTS:    Orders placed or performed during the hospital encounter of 04/16/16  . EKG 12 lead  . EKG 12 lead    DISCHARGE INSTRUCTIONS:    DISCHARGE MEDICATIONS:     Medication List    STOP  taking these medications   BAYER BACK & BODY 500-32.5 MG Tabs Generic drug:  Aspirin-Caffeine     TAKE these medications   Cats Claw 500 MG Caps Take 500 mg by mouth daily.   diazepam 5 MG tablet Commonly known as:  VALIUM Take 0.5-1 tablets (2.5-5 mg total) by mouth every 6 (six) hours as needed for muscle spasms or sedation.   HYDROmorphone 2 MG tablet Commonly known as:  DILAUDID Take 1-2 tablets (2-4 mg total) by mouth every 4 (four) hours as needed for severe pain (not controlled with oxycodone).   Iron 325 (65 Fe) MG Tabs Take 65 mg by mouth every other day.   lidocaine 5 % Commonly known as:  LIDODERM Place 1 patch onto the skin daily as needed (for back pain). Remove & Discard patch within 12 hours or as directed by MD   meloxicam 15 MG tablet Commonly known as:  MOBIC Take 15 mg by mouth daily.   ondansetron 4 MG tablet Commonly known as:  ZOFRAN Take 1 tablet (4 mg total) by mouth every 8 (eight) hours as needed for nausea or vomiting.    oxyCODONE-acetaminophen 5-325 MG tablet Commonly known as:  PERCOCET Take 1-2 tablets by mouth every 4 (four) hours as needed.   sildenafil 20 MG tablet Commonly known as:  REVATIO Take 20 mg by mouth daily as needed for wheezing.   valsartan-hydrochlorothiazide 320-25 MG tablet Commonly known as:  DIOVAN-HCT Take 1 tablet by mouth every evening.   Vitamin B-12 2500 MCG Subl Place 2,500 mcg under the tongue every other day.   vitamin C 1000 MG tablet Take 1,000 mg by mouth daily.   Vitamin D-3 5000 units Tabs Take 5,000 Units by mouth every 3 (three) days.   vitamin E 400 UNIT capsule Take 400 Units by mouth 2 (two) times a week.       FOLLOW UP VISIT:   Follow-up Information    Vania Rea SUPPLE, MD.   Specialty:  Orthopedic Surgery Why:  call to be seen in 10-14 days Contact information: [address] [place] [address] [phone]           DISCHARGE TO: Home   DISPOSITION: Good  DISCHARGE CONDITION:  Rodolph Bong for Dr. Francena Hanly 04/30/2016, 7:56 AM

## 2016-04-30 NOTE — Evaluation (Signed)
Occupational Therapy Evaluation Patient Details Name: [name] [name] MRN: [id number] DOB: June 16, 1955 Today's Date: 04/30/2016    History of Present Illness 60 yo male s/p R TSA   Past Medical History:  Diagnosis Date  . Asthma    as a child  . Dyspnea    while resting at night  . Headache   . Hypertension   . Rheumatoid arthritis without rheumatoid factor       Clinical Impression   Patient is s/p R TSA surgery resulting in functional limitations due to the deficits listed below (see OT problem list). PTA was independent with all adls.  Patient will benefit from skilled OT acutely to increase independence and safety with ADLS to allow discharge home with family (A). Pt will follow up with MD in 2 weeks and will get orders for outpatient as needed. OT to return later this AM to continue education due to nausea and pain at this time.      Follow Up Recommendations  No OT follow up;Other (comment) (MD will order in 2 weeks at follow up )    Equipment Recommendations  None recommended by OT    Recommendations for Other Services       Precautions / Restrictions Precautions Precautions: Shoulder Type of Shoulder Precautions: Passive Shoulder Interventions: Shoulder sling/immobilizer;Off for dressing/bathing/exercises Precaution Comments: handout for shoulder d/c sheet, pendulums and PROM FF ER ABDUCTION provided Required Braces or Orthoses: Sling Restrictions Weight Bearing Restrictions: Yes RUE Weight Bearing: Non weight bearing      Mobility Bed Mobility                  Transfers                      Balance                                            ADL Overall ADL's : Needs assistance/impaired Eating/Feeding: Set up;Bed level   Grooming: Minimal assistance Grooming Details (indicate cue type and reason): wife (A)ing with wiping face at this time due to sweating Upper Body Bathing: Moderate assistance   Lower Body  Bathing: Moderate assistance   Upper Body Dressing : Moderate assistance                     General ADL Comments: pt currently with nausea limiting progression with oob and exercises.wife present and educated on positioning for bed and chair      Vision     Perception     Praxis      Pertinent Vitals/Pain Pain Assessment: 0-10 Pain Score: 8  Pain Location: R shoulder Pain Descriptors / Indicators: Constant;Operative site guarding Pain Intervention(s): Premedicated before session;Repositioned;Monitored during session;Limited activity within patient's tolerance;RN gave pain meds during session;Ice applied     Hand Dominance Right   Extremity/Trunk Assessment Upper Extremity Assessment Upper Extremity Assessment: RUE deficits/detail;LUE deficits/detail RUE Deficits / Details: s/p surg LUE Deficits / Details: able to complete shoulder flexion to 100 degrees and reach R elbow active movement   Lower Extremity Assessment Lower Extremity Assessment: Overall WFL for tasks assessed   Cervical / Trunk Assessment Cervical / Trunk Assessment: Normal   Communication Communication Communication: No difficulties   Cognition Arousal/Alertness: Awake/alert Behavior During Therapy: Flat affect Overall Cognitive Status: Within Functional Limits for tasks assessed  General Comments       Exercises Exercises: Shoulder     Shoulder Instructions Shoulder Instructions Donning/doffing shirt without moving shoulder: Maximal assistance Method for sponge bathing under operated UE: Maximal assistance Donning/doffing sling/immobilizer: Maximal assistance Correct positioning of sling/immobilizer: Moderate assistance Pendulum exercises (written home exercise program):  (educated and demo and will complete next session) ROM for elbow, wrist and digits of operated UE: Supervision/safety Sling wearing schedule (on at all times/off for ADL's):  Supervision/safety Proper positioning of operated UE when showering: Supervision/safety    Home Living Family/patient expects to be discharged to:: Private residence Living Arrangements: Spouse/significant other Available Help at Discharge: Family Type of Home: House Home Access: Stairs to enter Secretary/administrator of Steps: 9 (2 in the back of the house) Entrance Stairs-Rails: None Home Layout: 1/2 bath on main level;Bed/bath upstairs;Able to live on main level with bedroom/bathroom     Bathroom Shower/Tub: Producer, television/film/video: Standard     Home Equipment: None   Additional Comments: pt plans to sleep on the first floor in recliner and sponge bath in half bath      Prior Functioning/Environment Level of Independence: Independent                 OT Problem List: Decreased strength;Decreased range of motion;Decreased activity tolerance;Impaired balance (sitting and/or standing);Decreased safety awareness;Decreased knowledge of use of DME or AE;Decreased knowledge of precautions;Pain;Impaired UE functional use;Obesity;Increased edema   OT Treatment/Interventions: Self-care/ADL training;Therapeutic exercise;DME and/or AE instruction;Therapeutic activities;Patient/family education;Balance training    OT Goals(Current goals can be found in the care plan section) Acute Rehab OT Goals Patient Stated Goal: none stated at this time OT Goal Formulation: With patient/family Time For Goal Achievement: 05/14/16 Potential to Achieve Goals: Good  OT Frequency: Min 3X/week   Barriers to D/C:            Co-evaluation              End of Session Nurse Communication: Mobility status;Precautions  Activity Tolerance: Patient limited by pain (nausea) Patient left: in bed;with call bell/phone within reach;with family/visitor present   Time: 3785-8850 OT Time Calculation (min): 20 min Charges:  OT General Charges $OT Visit: 1 Procedure OT Evaluation $OT  Eval Moderate Complexity: 1 Procedure G-Codes: OT G-codes **NOT FOR INPATIENT CLASS** Functional Assessment Tool Used: clinical judgement Functional Limitation: Self care Self Care Current Status (Y7741): At least 60 percent but less than 80 percent impaired, limited or restricted Self Care Goal Status (O8786): At least 20 percent but less than 40 percent impaired, limited or restricted  Harolyn Rutherford 04/30/2016, 8:22 AM   Mateo Flow   OTR/L Pager: [phone] Office: [phone] .

## 2017-05-31 HISTORY — PX: TOTAL SHOULDER REPLACEMENT: SUR1217

## 2017-11-09 ENCOUNTER — Other Ambulatory Visit: Payer: Self-pay | Admitting: Internal Medicine

## 2017-11-09 DIAGNOSIS — M5416 Radiculopathy, lumbar region: Secondary | ICD-10-CM

## 2017-11-17 ENCOUNTER — Ambulatory Visit
Admission: RE | Admit: 2017-11-17 | Discharge: 2017-11-17 | Disposition: A | Discharge status: Home / Self Care | Payer: 59 | Source: Ambulatory Visit | Attending: Internal Medicine | Admitting: Internal Medicine

## 2017-11-17 DIAGNOSIS — M5416 Radiculopathy, lumbar region: Secondary | ICD-10-CM

## 2017-11-25 ENCOUNTER — Ambulatory Visit
Admission: RE | Admit: 2017-11-25 | Discharge: 2017-11-25 | Disposition: A | Discharge status: Home / Self Care | Payer: 59 | Source: Ambulatory Visit | Attending: Internal Medicine | Admitting: Internal Medicine

## 2017-11-25 ENCOUNTER — Other Ambulatory Visit: Payer: Self-pay | Admitting: Internal Medicine

## 2017-11-25 DIAGNOSIS — R05 Cough: Secondary | ICD-10-CM

## 2017-11-25 DIAGNOSIS — R059 Cough, unspecified: Secondary | ICD-10-CM

## 2017-11-25 DIAGNOSIS — R0602 Shortness of breath: Secondary | ICD-10-CM

## 2019-05-04 ENCOUNTER — Telehealth: Payer: Self-pay | Admitting: Podiatry

## 2019-05-04 NOTE — Telephone Encounter (Signed)
Calling to follow up on medical records status. Release point reference number is [id number].

## 2019-08-04 ENCOUNTER — Ambulatory Visit: Payer: Self-pay | Attending: Internal Medicine

## 2019-08-04 DIAGNOSIS — Z23 Encounter for immunization: Secondary | ICD-10-CM | POA: Insufficient documentation

## 2019-08-04 NOTE — Progress Notes (Signed)
   Covid-19 Vaccination Clinic  Name:  [name] [name]    MRN: [id number] DOB: 1956/04/26  08/04/2019  [name] was observed post Covid-19 immunization for 15 minutes without incident. He was provided with Vaccine Information Sheet and instruction to access the V-Safe system.   [name] was instructed to call 911 with any severe reactions post vaccine: Marland Kitchen Difficulty breathing  . Swelling of face and throat  . A fast heartbeat  . A bad rash all over body  . Dizziness and weakness   Immunizations Administered    Name Date Dose VIS Date Route   Pfizer COVID-19 Vaccine 08/04/2019  1:08 PM 0.3 mL 05/11/2019 Intramuscular   Manufacturer: [employer], [employer]   Lot: [id number]   NDC: [address]-8

## 2019-08-25 ENCOUNTER — Ambulatory Visit: Payer: Self-pay | Attending: Internal Medicine

## 2019-08-25 DIAGNOSIS — Z23 Encounter for immunization: Secondary | ICD-10-CM

## 2019-08-25 NOTE — Progress Notes (Signed)
   Covid-19 Vaccination Clinic  Name:  [name] [name]    MRN: [id number] DOB: 06-May-1956  08/25/2019  [name] was observed post Covid-19 immunization for 15 minutes without incident. He was provided with Vaccine Information Sheet and instruction to access the V-Safe system.   [name] was instructed to call 911 with any severe reactions post vaccine: Marland Kitchen Difficulty breathing  . Swelling of face and throat  . A fast heartbeat  . A bad rash all over body  . Dizziness and weakness   Immunizations Administered    Name Date Dose VIS Date Route   Pfizer COVID-19 Vaccine 08/25/2019  9:57 AM 0.3 mL 05/11/2019 Intramuscular   Manufacturer: [employer], [employer]   Lot: [id number]   NDC: [address]-0

## 2021-04-13 ENCOUNTER — Other Ambulatory Visit: Payer: Self-pay | Admitting: Family Medicine

## 2021-04-13 ENCOUNTER — Other Ambulatory Visit: Payer: Self-pay

## 2021-04-13 ENCOUNTER — Ambulatory Visit
Admission: RE | Admit: 2021-04-13 | Discharge: 2021-04-13 | Disposition: A | Discharge status: Home / Self Care | Payer: Self-pay | Source: Ambulatory Visit | Attending: Family Medicine | Admitting: Family Medicine

## 2021-04-13 DIAGNOSIS — J4540 Moderate persistent asthma, uncomplicated: Secondary | ICD-10-CM

## 2021-05-27 ENCOUNTER — Other Ambulatory Visit (HOSPITAL_COMMUNITY): Payer: Self-pay | Admitting: Family Medicine

## 2021-05-27 DIAGNOSIS — J22 Unspecified acute lower respiratory infection: Secondary | ICD-10-CM

## 2021-06-11 ENCOUNTER — Other Ambulatory Visit (HOSPITAL_COMMUNITY): Payer: Self-pay | Admitting: Family Medicine

## 2021-06-11 ENCOUNTER — Ambulatory Visit
Admission: RE | Admit: 2021-06-11 | Discharge: 2021-06-11 | Disposition: A | Discharge status: Home / Self Care | Payer: Medicare Other | Source: Ambulatory Visit | Attending: Family Medicine | Admitting: Family Medicine

## 2021-06-11 DIAGNOSIS — J22 Unspecified acute lower respiratory infection: Secondary | ICD-10-CM

## 2021-11-26 ENCOUNTER — Other Ambulatory Visit: Payer: Self-pay | Admitting: Family Medicine

## 2021-11-26 DIAGNOSIS — R0600 Dyspnea, unspecified: Secondary | ICD-10-CM

## 2021-11-27 ENCOUNTER — Ambulatory Visit (INDEPENDENT_AMBULATORY_CARE_PROVIDER_SITE_OTHER): Payer: Medicare Other | Admitting: Internal Medicine

## 2021-11-27 DIAGNOSIS — R0600 Dyspnea, unspecified: Secondary | ICD-10-CM | POA: Diagnosis not present

## 2021-11-27 LAB — PULMONARY FUNCTION TEST
DL/VA % pred: 89 %
DL/VA: 3.65 ml/min/mmHg/L
DLCO cor % pred: 75 %
DLCO cor: 20.68 ml/min/mmHg
DLCO unc % pred: 75 %
DLCO unc: 20.68 ml/min/mmHg
FEF 25-75 Post: 1.46 L/sec
FEF 25-75 Pre: 1.43 L/sec
FEF2575-%Change-Post: 1 %
FEF2575-%Pred-Post: 53 %
FEF2575-%Pred-Pre: 52 %
FEV1-%Change-Post: 0 %
FEV1-%Pred-Post: 62 %
FEV1-%Pred-Pre: 61 %
FEV1-Post: 2.18 L
FEV1-Pre: 2.16 L
FEV1FVC-%Change-Post: -5 %
FEV1FVC-%Pred-Pre: 96 %
FEV6-%Change-Post: 6 %
FEV6-%Pred-Post: 71 %
FEV6-%Pred-Pre: 66 %
FEV6-Post: 3.18 L
FEV6-Pre: 2.98 L
FEV6FVC-%Change-Post: 0 %
FEV6FVC-%Pred-Post: 104 %
FEV6FVC-%Pred-Pre: 103 %
FVC-%Change-Post: 6 %
FVC-%Pred-Post: 68 %
FVC-%Pred-Pre: 64 %
FVC-Post: 3.22 L
FVC-Pre: 3.02 L
Post FEV1/FVC ratio: 68 %
Post FEV6/FVC ratio: 99 %
Pre FEV1/FVC ratio: 72 %
Pre FEV6/FVC Ratio: 99 %
RV % pred: 96 %
RV: 2.36 L
TLC % pred: 89 %
TLC: 6.43 L

## 2021-11-27 NOTE — Progress Notes (Signed)
Full PFT Completed Today 

## 2021-11-27 NOTE — Patient Instructions (Signed)
Full PFT Performed Today  

## 2021-12-04 ENCOUNTER — Telehealth: Payer: Self-pay | Admitting: Internal Medicine

## 2021-12-04 NOTE — Telephone Encounter (Signed)
Spoke with the pt  He wants to obtain copy of PFT he had done here  I printed and placed in mail after verified his home address  Nothing further needed

## 2022-02-23 ENCOUNTER — Ambulatory Visit (INDEPENDENT_AMBULATORY_CARE_PROVIDER_SITE_OTHER): Payer: Medicare Other

## 2022-02-23 ENCOUNTER — Encounter: Payer: Self-pay | Admitting: Podiatry

## 2022-02-23 ENCOUNTER — Ambulatory Visit (INDEPENDENT_AMBULATORY_CARE_PROVIDER_SITE_OTHER): Payer: Medicare Other | Admitting: Podiatry

## 2022-02-23 DIAGNOSIS — M5416 Radiculopathy, lumbar region: Secondary | ICD-10-CM | POA: Diagnosis not present

## 2022-02-23 DIAGNOSIS — M2141 Flat foot [pes planus] (acquired), right foot: Secondary | ICD-10-CM

## 2022-02-23 DIAGNOSIS — M2142 Flat foot [pes planus] (acquired), left foot: Secondary | ICD-10-CM | POA: Diagnosis not present

## 2022-02-23 DIAGNOSIS — M19079 Primary osteoarthritis, unspecified ankle and foot: Secondary | ICD-10-CM | POA: Diagnosis not present

## 2022-02-23 DIAGNOSIS — M79671 Pain in right foot: Secondary | ICD-10-CM

## 2022-02-23 DIAGNOSIS — M79672 Pain in left foot: Secondary | ICD-10-CM

## 2022-02-23 NOTE — Progress Notes (Signed)
  Subjective:  Patient ID: [name] [name], male    DOB: [birth year],   MRN: [id number]  Chief Complaint  Patient presents with   Foot Pain    Foot pain in bilateral feet, has chronic back and joint pain     66 y.o. male presents for concern of bilateral foot pain that has been going on for years but has recently started to worsen. Relates pain in his feet after being on them long periods. States pain is in his ankles and on tops and bottoms of his feet. Occasionally cause tingling and burning as well.  Relates a history of chronic back and joint pain as well. Does follow with pain medicine for this and on narcotics and appears to be on meloxicam as well.   Doe have a history of RA. Marland Kitchen Denies any other pedal complaints. Denies n/v/f/c.   Past Medical History:  Diagnosis Date   Asthma    as a child   Dyspnea    while resting at night   Headache    Hypertension    Rheumatoid arthritis without rheumatoid factor     Objective:  Physical Exam: Vascular: DP/PT pulses 2/4 bilateral. CFT <3 seconds. Normal hair growth on digits. No edema.  Skin. No lacerations or abrasions bilateral feet.  Musculoskeletal: MMT 5/5 bilateral lower extremities in DF, PF, Inversion and Eversion. Deceased ROM in DF of ankle joint. No pain to palpation elicited on dorsal or plantar foot or around ankle. Negative tinels signs. No erythema or edema noted. Mild pes planus noted bilateral worse so on the right.  Neurological: Sensation intact to light touch.   Assessment:   1. Bilateral pes planus   2. Arthritis of midfoot   3. Lumbar radiculopathy      Plan:  Patient was evaluated and treated and all questions answered. -Xrays reviewed. No acute fractures or dislocations. Mild collapse of medial arch bilateral worse so on the right. Mild degenerative changes noted throughout bilateral midfoot.  -Discussed treatement options; discussed pes planus deformity;conservative and  surgical  -Fitted for CMO today.   -Recommend good supportive shoes -Recommend daily stretching and icing -Recommend anti-inflammatories as needed -Patient to return to office in 3 months for recheck.    Lorenda Peck, DPM

## 2022-02-24 ENCOUNTER — Encounter: Payer: Self-pay | Admitting: Pulmonary Disease

## 2022-02-24 ENCOUNTER — Ambulatory Visit (INDEPENDENT_AMBULATORY_CARE_PROVIDER_SITE_OTHER): Payer: Medicare Other | Admitting: Pulmonary Disease

## 2022-02-24 VITALS — BP 122/78 | HR 95 | Temp 97.9°F | Ht 71.0 in | Wt 298.0 lb

## 2022-02-24 DIAGNOSIS — R0609 Other forms of dyspnea: Secondary | ICD-10-CM | POA: Diagnosis not present

## 2022-02-24 NOTE — Progress Notes (Signed)
[name] [name]    [id number]    24-Jan-1956  Primary Care Physician:Shah, Talbert Forest, MD  Referring Physician: Roselee Nova, MD [address] [place],  [place] [address]  Chief complaint:   Concern with shortness of breath and to discuss pulmonary function test  HPI:  Recently had a pulmonary function test done showing moderate obstruction with no significant bronchodilator response, study limited by use of Breo about an hour before study  Shortness of breath with activity  History of childhood asthma which he grew out of in his teenage years Recently has been using albuterol on a regular basis and Breo daily  He has had multiple surgeries including bilateral knee replacement, hip surgery, back surgery Limited with activities with pain and shortness of breath  He uses albuterol even at rest when he feels is not able to get his breath  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   amLODipine (NORVASC) 2.5 MG tablet    Ascorbic Acid (VITAMIN C) 1000 MG tablet Take 1,000 mg by mouth daily.   atorvastatin (LIPITOR) 20 MG tablet Take 1 tablet by mouth daily.   BREO ELLIPTA 200-25 MCG/ACT AEPB Inhale into the lungs.   Cats Claw 500 MG CAPS Take 500 mg by mouth daily.   Cholecalciferol (VITAMIN D-3) 5000 units TABS Take 5,000 Units by mouth every 3 (three) days.   Cyanocobalamin (VITAMIN B-12) 2500 MCG SUBL Place 2,500 mcg under the tongue every other day.   diazepam (VALIUM) 5 MG tablet Take 0.5-1 tablets (2.5-5 mg total) by mouth every 6 (six) hours as needed for muscle spasms or sedation.   Ferrous Sulfate (IRON) 325 (65 Fe) MG TABS Take 65 mg by mouth every other  day.   HYDROmorphone (DILAUDID) 2 MG tablet Take 1-2 tablets (2-4 mg total) by mouth every 4 (four) hours as needed for severe pain (not controlled with oxycodone).   lidocaine (LIDODERM) 5 % Place 1 patch onto the skin daily as needed (for back pain). Remove & Discard patch within 12 hours or as directed by MD   meloxicam (MOBIC) 15 MG tablet Take 15 mg by mouth daily.   ondansetron (ZOFRAN) 4 MG tablet Take 1 tablet (4 mg total) by mouth every 8 (eight) hours as needed for nausea or vomiting.   oxyCODONE-acetaminophen (PERCOCET) 5-325 MG tablet Take 1-2 tablets by mouth every 4 (four) hours as needed.   sildenafil (REVATIO) 20 MG tablet Take 20 mg by mouth daily as needed for wheezing.   valsartan-hydrochlorothiazide (DIOVAN-HCT) 320-25 MG per tablet Take 1 tablet by mouth every evening.    vitamin E 400 UNIT capsule Take 400 Units by mouth 2 (two) times a week.   No facility-administered encounter medications on file as of 02/24/2022.    Allergies as of 02/24/2022 - Review Complete 02/24/2022  Allergen Reaction Noted   Monosodium glutamate Anaphylaxis 04/06/2014   Oxycodone Nausea Only 12/28/2017   Tramadol Nausea Only and Nausea And Vomiting 03/24/2017   Darvon [propoxyphene] Other (See Comments) 11/16/2012   Codeine Nausea Only 03/24/2017   Latex Itching 03/09/2018   Nsaids Other (See Comments) 11/16/2012  Past Medical History:  Diagnosis Date   Asthma    as a child   Dyspnea    while resting at night   Headache    Hypertension    Rheumatoid arthritis without rheumatoid factor     Past Surgical History:  Procedure Laterality Date   KNEE REPLACED Bilateral 02/2009   tarsal exostectomy right foot     TOTAL SHOULDER ARTHROPLASTY Right 04/29/2016   Procedure: TOTAL SHOULDER ARTHROPLASTY;  Surgeon: Francena Hanly, MD;  Location: [facility] OR;  Service: Orthopedics;  Laterality: Right;    Family History  Problem Relation Age of Onset   Cancer Mother    Rheum arthritis Mother      Social History   Socioeconomic History   Marital status: Married    Spouse name: Not on file   Number of children: Not on file   Years of education: Not on file   Highest education level: Not on file  Occupational History   Not on file  Tobacco Use   Smoking status: Never   Smokeless tobacco: Never  Substance and Sexual Activity   Alcohol use: Yes    Alcohol/week: 2.0 standard drinks of alcohol    Types: 2 Glasses of wine per week    Comment: once every two weeks   Drug use: No   Sexual activity: Not on file  Other Topics Concern   Not on file  Social History Narrative   Not on file   Social Determinants of Health   Financial Resource Strain: Not on file  Food Insecurity: Not on file  Transportation Needs: Not on file  Physical Activity: Not on file  Stress: Not on file  Social Connections: Not on file  Intimate Partner Violence: Not on file    Review of Systems  Respiratory:  Positive for shortness of breath.     Vitals:   02/24/22 1051  BP: 122/78  Pulse: 95  Temp: 97.9 F (36.6 C)  SpO2: 100%     Physical Exam Constitutional:      Appearance: He is obese.  HENT:     Head: Normocephalic.     Mouth/Throat:     Mouth: Mucous membranes are moist.  Cardiovascular:     Rate and Rhythm: Normal rate and regular rhythm.  Pulmonary:     Effort: Pulmonary effort is normal. No respiratory distress.     Breath sounds: No stridor. No wheezing or rhonchi.  Musculoskeletal:     Cervical back: No rigidity or tenderness.  Neurological:     Mental Status: He is alert.     Cranial Nerves: No cranial nerve deficit.     Data Reviewed: PFT reviewed with the patient showing moderate obstruction with no significant bronchodilator response, no restriction, normal diffusing capacity  .  Most recent chest x-ray was in January 2023 showing no significant infiltrative process-reviewed by myself  Assessment:  Shortness of breath  Deconditioning  Multiple  joint problems related to pain or discomfort that may be contributing to activity limitation  History of childhood asthma for which he is on Breo and albuterol as needed  Plan/Recommendations: Patient was reassured regarding his pulmonary function test findings  We will repeat a PFT at next visit, encouraged to ensure that he does not use his inhaler on the day of the study  Continue Breo  Continue albuterol as needed  Graded exercise as tolerated  Importance of graded exercise and physical activity was discussed extensively  Tentative follow-up in 3 months   Zyanna Leisinger  Wynona Neat MD [facility] 02/24/2022, 10:58 AM  CC: Ellyn Hack, MD

## 2022-02-24 NOTE — Patient Instructions (Signed)
Continue Breo daily Albuterol use as needed You may use albuterol before activities  Graded activities as tolerated with goal to increase activities  Repeat PFT at next visit in 3 months

## 2022-02-26 ENCOUNTER — Other Ambulatory Visit: Payer: Self-pay | Admitting: Podiatry

## 2022-02-26 DIAGNOSIS — M2142 Flat foot [pes planus] (acquired), left foot: Secondary | ICD-10-CM

## 2022-03-13 ENCOUNTER — Telehealth: Payer: Self-pay | Admitting: Podiatry

## 2022-03-13 NOTE — Telephone Encounter (Signed)
LVM FOR PATIENT TO CALL AND SCHEDULE AN APPOINTMENT FOR ORTHOTICS 

## 2022-03-17 ENCOUNTER — Ambulatory Visit (INDEPENDENT_AMBULATORY_CARE_PROVIDER_SITE_OTHER): Payer: Medicare Other

## 2022-03-17 DIAGNOSIS — M2141 Flat foot [pes planus] (acquired), right foot: Secondary | ICD-10-CM

## 2022-03-17 DIAGNOSIS — M2142 Flat foot [pes planus] (acquired), left foot: Secondary | ICD-10-CM

## 2022-03-17 NOTE — Progress Notes (Signed)
Patient presents today to pick up custom molded foot orthotics, diagnosed with pes planus by Dr. Blenda Mounts.   Orthotics were dispensed and fit was satisfactory. Reviewed instructions for break-in and wear. Written instructions given to patient.  Patient will follow up as needed.   [facility] - order # [id number]

## 2022-05-06 ENCOUNTER — Ambulatory Visit: Payer: Medicare Other | Admitting: Pulmonary Disease

## 2022-06-08 ENCOUNTER — Ambulatory Visit: Payer: Medicare Other | Admitting: Pulmonary Disease

## 2022-06-16 ENCOUNTER — Encounter: Payer: Medicare Other | Admitting: Internal Medicine

## 2022-08-12 ENCOUNTER — Other Ambulatory Visit: Payer: Self-pay | Admitting: Student

## 2022-08-12 DIAGNOSIS — N183 Chronic kidney disease, stage 3 unspecified: Secondary | ICD-10-CM

## 2022-09-09 IMAGING — CR DG CHEST 2V
2 series · 2 of 2 positions shown · non-contrast
Comparison: 04/13/2021

CLINICAL DATA: 65-year-old male with respiratory infection

EXAM:
CHEST - 2 VIEW

[w chest pa]
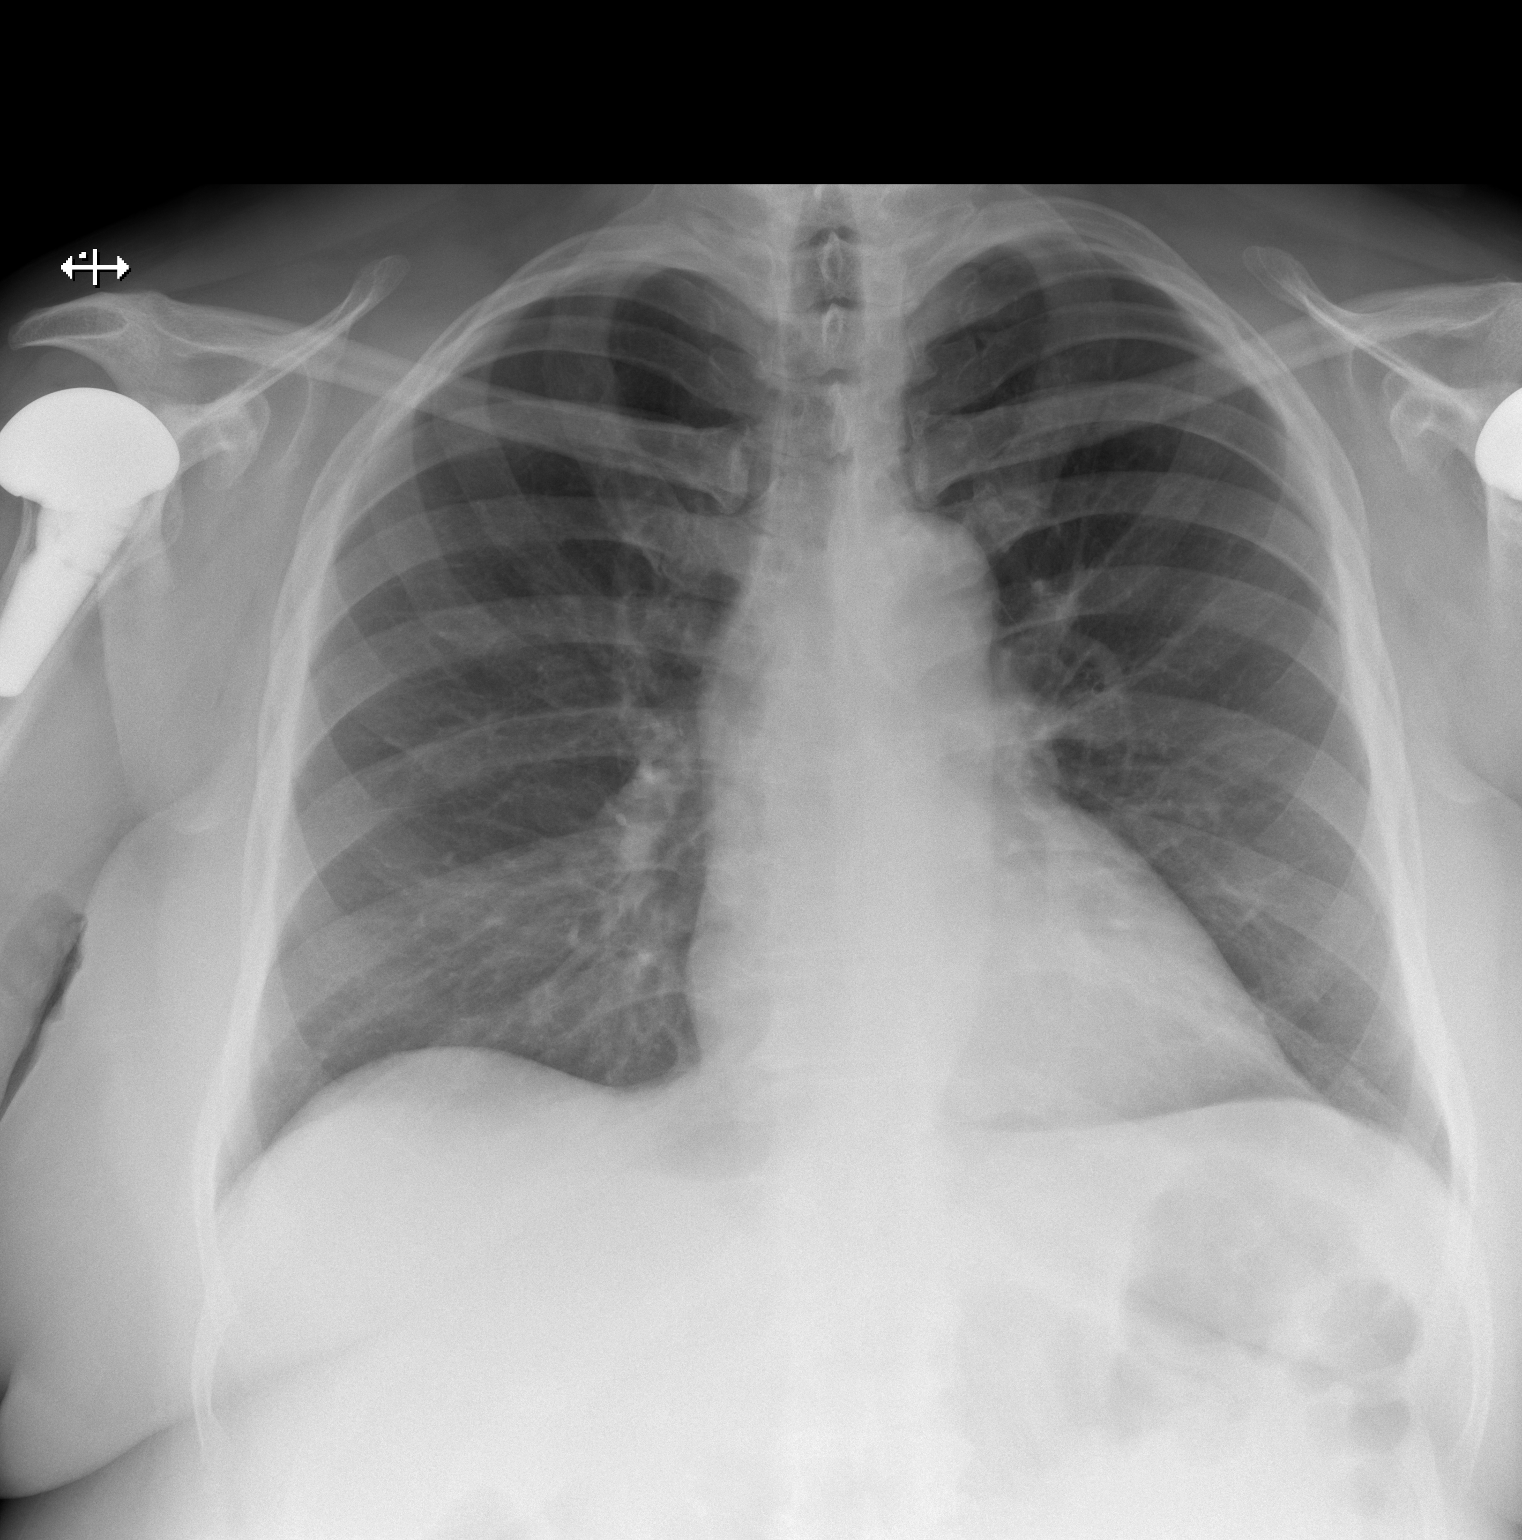

[w chest lat]
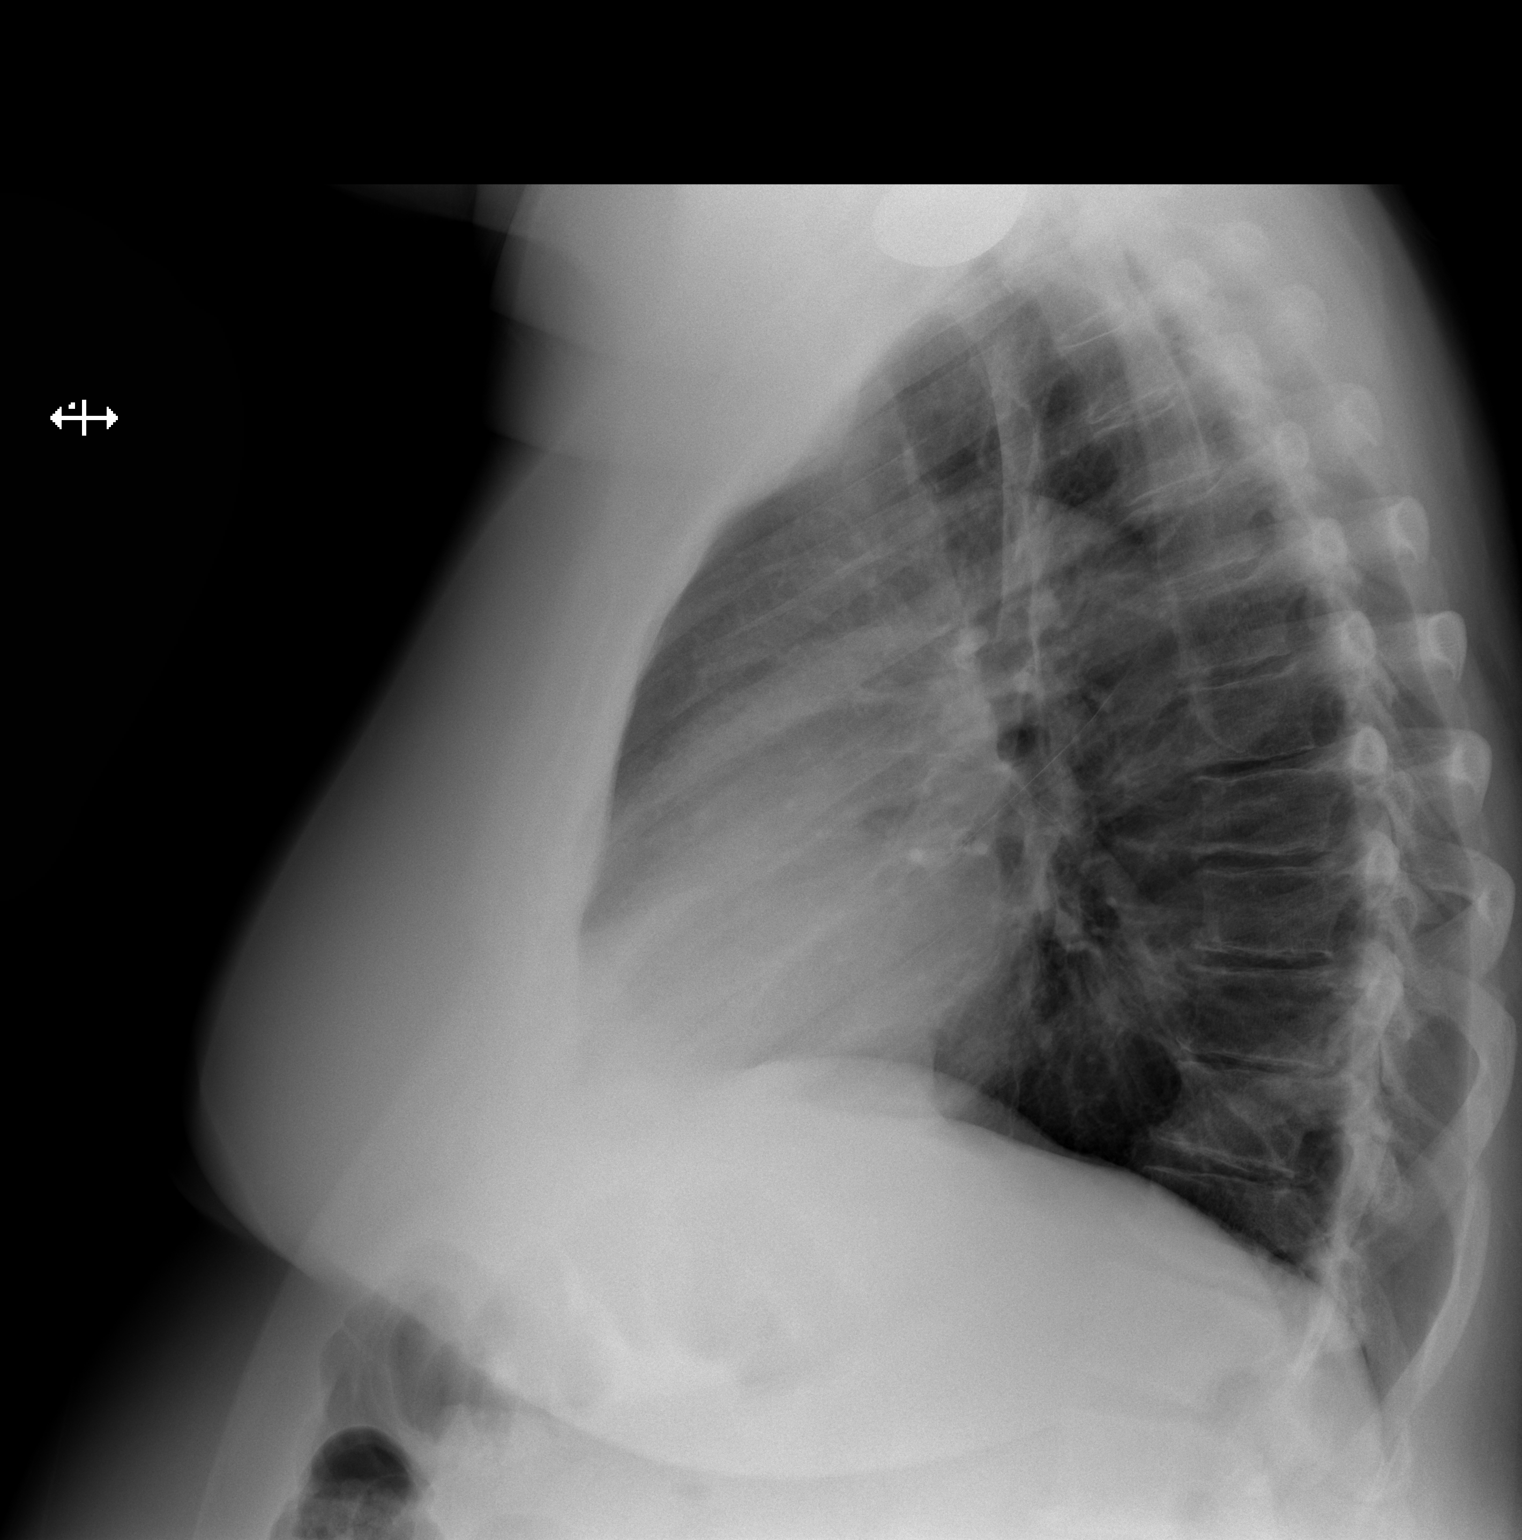

[2 of 2 positions shown; findings below may reference images not displayed]

FINDINGS: Cardiomediastinal silhouette unchanged in size and contour. No
evidence of central vascular congestion. No interlobular septal
thickening.

No pneumothorax or pleural effusion. Coarsened interstitial
markings, with no confluent airspace disease.

No acute displaced fracture. Degenerative changes of the spine.

Surgical changes of the glenohumeral joints, incompletely imaged
IMPRESSION: No active cardiopulmonary disease.

## 2022-09-14 ENCOUNTER — Encounter: Payer: Self-pay | Admitting: Family

## 2022-09-14 ENCOUNTER — Ambulatory Visit (INDEPENDENT_AMBULATORY_CARE_PROVIDER_SITE_OTHER): Payer: Medicare Other | Admitting: Family

## 2022-09-14 VITALS — BP 132/82 | HR 74 | Temp 98.6°F | Resp 16 | Ht 68.9 in | Wt 288.4 lb

## 2022-09-14 DIAGNOSIS — E559 Vitamin D deficiency, unspecified: Secondary | ICD-10-CM

## 2022-09-14 DIAGNOSIS — Z7689 Persons encountering health services in other specified circumstances: Secondary | ICD-10-CM | POA: Diagnosis not present

## 2022-09-14 DIAGNOSIS — I129 Hypertensive chronic kidney disease with stage 1 through stage 4 chronic kidney disease, or unspecified chronic kidney disease: Secondary | ICD-10-CM | POA: Diagnosis not present

## 2022-09-14 DIAGNOSIS — E785 Hyperlipidemia, unspecified: Secondary | ICD-10-CM

## 2022-09-14 DIAGNOSIS — Z5181 Encounter for therapeutic drug level monitoring: Secondary | ICD-10-CM

## 2022-09-14 DIAGNOSIS — Z1159 Encounter for screening for other viral diseases: Secondary | ICD-10-CM

## 2022-09-14 DIAGNOSIS — Z6841 Body Mass Index (BMI) 40.0 and over, adult: Secondary | ICD-10-CM

## 2022-09-14 DIAGNOSIS — G8929 Other chronic pain: Secondary | ICD-10-CM

## 2022-09-14 DIAGNOSIS — E538 Deficiency of other specified B group vitamins: Secondary | ICD-10-CM

## 2022-09-14 DIAGNOSIS — M545 Low back pain, unspecified: Secondary | ICD-10-CM

## 2022-09-14 DIAGNOSIS — R5382 Chronic fatigue, unspecified: Secondary | ICD-10-CM

## 2022-09-14 DIAGNOSIS — N183 Chronic kidney disease, stage 3 unspecified: Secondary | ICD-10-CM

## 2022-09-14 NOTE — Progress Notes (Unsigned)
Provider: Richarda Blade FNP-C   Fusako Tanabe, Donalee Citrin, NP  Patient Care Team: Shawni Volkov, Donalee Citrin, NP as PCP - General (Family Medicine)  Extended Emergency Contact Information Primary Emergency Contact: Kindley,Bonita  [place] Home Phone: [phone] Relation: Spouse  Code Status:  Full Code  Goals of care: Advanced Directive information    04/29/2016    5:56 AM  Advanced Directives  Does Patient Have a Medical Advance Directive? No     Chief Complaint  Patient presents with   Establish Care    New patient to establish care. Needs referral to nephrology for kidney disease.     HPI:  Pt is a 67 y.o. male seen today establish care here at [facility] for medical management of chronic diseases.He is here with spouse.Has a medical history of Hypertension,chronic Kidney disease stage 3 a ,chronic lower back pain on Hydromorphone 2-4 mg every 4 hrs PRN, chronic osteoarthritis,COPD,vitamin D deficiency, Morbid Obesity among others.  States was told in March,2024 that he has CKD stage 3 a and yet he has never had Kidney issues when he started seeing previous PCP.  He would like lab rechecked and then referral to Nephrologist. States was previous on Ozempic 0.25 mg injection but stopped since insurance declined.   Past Medical History:  Diagnosis Date   Asthma    as a child   Dyspnea    while resting at night   Headache    Hyperlipidemia    Hypertension    Kidney disease    Primary generalized (osteo)arthritis    Rheumatoid arthritis without rheumatoid factor    Past Surgical History:  Procedure Laterality Date   BACK SURGERY     EYE SURGERY     KNEE REPLACED Bilateral 02/28/2009   tarsal exostectomy right foot     TOTAL HIP ARTHROPLASTY Left    TOTAL SHOULDER ARTHROPLASTY Right 04/29/2016   Procedure: TOTAL SHOULDER ARTHROPLASTY;  Surgeon: Francena Hanly, MD;  Location: [facility] OR;  Service: Orthopedics;  Laterality: Right;   TOTAL SHOULDER  REPLACEMENT Left 2019    Allergies  Allergen Reactions   Monosodium Glutamate Anaphylaxis    MSG   Oxycodone Nausea Only    Other reaction(s): vomiting   Tramadol Nausea Only and Nausea And Vomiting    Other reaction(s): vomiting   Darvon [Propoxyphene] Other (See Comments)    Throat tightness   Codeine Nausea Only   Latex Itching   Nsaids Other (See Comments)    ~4 hours later he experiences wheezing    Allergies as of 09/14/2022       Reactions   Monosodium Glutamate Anaphylaxis   MSG   Oxycodone Nausea Only   Other reaction(s): vomiting   Tramadol Nausea Only, Nausea And Vomiting   Other reaction(s): vomiting   Darvon [propoxyphene] Other (See Comments)   Throat tightness   Codeine Nausea Only   Latex Itching   Nsaids Other (See Comments)   ~4 hours later he experiences wheezing        Medication List        Accurate as of September 14, 2022  1:54 PM. If you have any questions, ask your nurse or doctor.          albuterol 108 (90 Base) MCG/ACT inhaler Commonly known as: VENTOLIN HFA Inhale into the lungs.   amLODipine 2.5 MG tablet Commonly known as: NORVASC   atorvastatin 20 MG tablet Commonly known as: LIPITOR Take 1 tablet by mouth daily.  Breo Ellipta 200-25 MCG/ACT Aepb Generic drug: fluticasone furoate-vilanterol Inhale into the lungs.   Cats Claw 500 MG Caps Take 500 mg by mouth daily.   diazepam 5 MG tablet Commonly known as: Valium Take 0.5-1 tablets (2.5-5 mg total) by mouth every 6 (six) hours as needed for muscle spasms or sedation.   HYDROmorphone 2 MG tablet Commonly known as: Dilaudid Take 1-2 tablets (2-4 mg total) by mouth every 4 (four) hours as needed for severe pain (not controlled with oxycodone).   Iron 325 (65 Fe) MG Tabs Take 65 mg by mouth every other day.   lidocaine 5 % Commonly known as: LIDODERM Place 1 patch onto the skin daily as needed (for back pain). Remove & Discard patch within 12 hours or as directed by  MD   meloxicam 15 MG tablet Commonly known as: MOBIC Take 15 mg by mouth daily.   ondansetron 4 MG tablet Commonly known as: Zofran Take 1 tablet (4 mg total) by mouth every 8 (eight) hours as needed for nausea or vomiting.   oxyCODONE-acetaminophen 5-325 MG tablet Commonly known as: Percocet Take 1-2 tablets by mouth every 4 (four) hours as needed.   sildenafil 20 MG tablet Commonly known as: REVATIO Take 20 mg by mouth daily as needed for wheezing.   valsartan-hydrochlorothiazide 320-25 MG tablet Commonly known as: DIOVAN-HCT Take 1 tablet by mouth every evening.   Vitamin B-12 2500 MCG Subl Place 2,500 mcg under the tongue every other day.   vitamin C 1000 MG tablet Take 1,000 mg by mouth daily.   Vitamin D-3 125 MCG (5000 UT) Tabs Take 5,000 Units by mouth every 3 (three) days.   vitamin E 180 MG (400 UNITS) capsule Take 400 Units by mouth 2 (two) times a week.        Review of Systems  Constitutional:  Positive for fatigue. Negative for appetite change, chills, fever and unexpected weight change.  HENT:  Negative for congestion, dental problem, ear discharge, ear pain, facial swelling, hearing loss, nosebleeds, postnasal drip, rhinorrhea, sinus pressure, sinus pain, sneezing, sore throat, tinnitus and trouble swallowing.   Eyes:  Positive for visual disturbance. Negative for pain, discharge, redness and itching.       Wears eye glasses follows up with [facility]   Respiratory:  Negative for cough, chest tightness, shortness of breath and wheezing.   Cardiovascular:  Negative for chest pain, palpitations and leg swelling.  Gastrointestinal:  Negative for abdominal distention, abdominal pain, blood in stool, constipation, diarrhea, nausea and vomiting.  Endocrine: Negative for cold intolerance, heat intolerance, polydipsia, polyphagia and polyuria.  Genitourinary:  Negative for difficulty urinating, dysuria, flank pain, frequency and urgency.  Musculoskeletal:   Positive for arthralgias and back pain. Negative for gait problem, joint swelling, myalgias, neck pain and neck stiffness.  Skin:  Negative for color change, pallor, rash and wound.  Neurological:  Negative for dizziness, syncope, speech difficulty, weakness, light-headedness, numbness and headaches.       Dizziness with getting up due to medication   Hematological:  Does not bruise/bleed easily.  Psychiatric/Behavioral:  Positive for sleep disturbance. Negative for agitation, behavioral problems, confusion, hallucinations, self-injury and suicidal ideas. The patient is not nervous/anxious.        Takes Tylenol PM or melatonin to help with sleep or sleepy time tea     Immunization History  Administered Date(s) Administered   Influenza, High Dose Seasonal PF 03/03/2020   PFIZER(Purple Top)SARS-COV-2 Vaccination 08/04/2019, 08/25/2019   Tdap 12/24/2014  Zoster Recombinat (Shingrix) 03/30/2019, 05/19/2019   Pertinent  Health Maintenance Due  Topic Date Due   INFLUENZA VACCINE  12/30/2022      09/14/2022    1:25 PM  Fall Risk  Falls in the past year? 0  Was there an injury with Fall? 0  Fall Risk Category Calculator 0  Patient at Risk for Falls Due to No Fall Risks   Functional Status Survey: Is the patient deaf or have difficulty hearing?: No Does the patient have difficulty seeing, even when wearing glasses/contacts?: No Does the patient have difficulty concentrating, remembering, or making decisions?: No Does the patient have difficulty walking or climbing stairs?: No Does the patient have difficulty dressing or bathing?: No Does the patient have difficulty doing errands alone such as visiting a doctor's office or shopping?: No  Vitals:   09/14/22 1327  BP: 132/82  Pulse: 74  Resp: 16  Temp: 98.6 F (37 C)  TempSrc: Temporal  SpO2: 97%  Weight: 288 lb 6.4 oz (130.8 kg)  Height: 5' 8.9" (1.75 m)   Body mass index is 42.72 kg/m. Physical Exam Vitals reviewed.   Constitutional:      General: He is not in acute distress.    Appearance: Normal appearance. He is morbidly obese. He is not ill-appearing or diaphoretic.  HENT:     Head: Normocephalic.     Right Ear: Tympanic membrane, ear canal and external ear normal. There is no impacted cerumen.     Left Ear: Tympanic membrane, ear canal and external ear normal. There is no impacted cerumen.     Nose: Nose normal. No congestion or rhinorrhea.     Mouth/Throat:     Mouth: Mucous membranes are moist.     Pharynx: Oropharynx is clear. No oropharyngeal exudate or posterior oropharyngeal erythema.  Eyes:     General: No scleral icterus.       Right eye: No discharge.        Left eye: No discharge.     Extraocular Movements: Extraocular movements intact.     Conjunctiva/sclera: Conjunctivae normal.     Pupils: Pupils are equal, round, and reactive to light.  Neck:     Vascular: No carotid bruit.  Cardiovascular:     Rate and Rhythm: Normal rate and regular rhythm.     Pulses: Normal pulses.     Heart sounds: Normal heart sounds. No murmur heard.    No friction rub. No gallop.  Pulmonary:     Effort: Pulmonary effort is normal. No respiratory distress.     Breath sounds: Normal breath sounds. No wheezing, rhonchi or rales.  Chest:     Chest wall: No tenderness.  Abdominal:     General: Bowel sounds are normal. There is no distension.     Palpations: Abdomen is soft. There is no mass.     Tenderness: There is no abdominal tenderness. There is no right CVA tenderness, left CVA tenderness, guarding or rebound.  Musculoskeletal:        General: No swelling or tenderness. Normal range of motion.     Cervical back: Normal range of motion. No rigidity or tenderness.     Right lower leg: No edema.     Left lower leg: No edema.  Lymphadenopathy:     Cervical: No cervical adenopathy.  Skin:    General: Skin is warm and dry.     Coloration: Skin is not pale.     Findings: No bruising, erythema,  lesion or rash.  Neurological:     Mental Status: He is alert and oriented to person, place, and time.     Cranial Nerves: No cranial nerve deficit.     Sensory: No sensory deficit.     Motor: No weakness.     Coordination: Coordination normal.     Gait: Gait normal.  Psychiatric:        Mood and Affect: Mood normal.        Speech: Speech normal.        Behavior: Behavior normal.        Thought Content: Thought content normal.        Judgment: Judgment normal.     Labs reviewed: No results for input(s): "NA", "K", "CL", "CO2", "GLUCOSE", "BUN", "CREATININE", "CALCIUM", "MG", "PHOS" in the last 8760 hours. No results for input(s): "AST", "ALT", "ALKPHOS", "BILITOT", "PROT", "ALBUMIN" in the last 8760 hours. No results for input(s): "WBC", "NEUTROABS", "HGB", "HCT", "MCV", "PLT" in the last 8760 hours. No results found for: "TSH" No results found for: "HGBA1C" No results found for: "CHOL", "HDL", "LDLCALC", "LDLDIRECT", "TRIG", "CHOLHDL"  Significant Diagnostic Results in last 30 days:  No results found.  Assessment/Plan There are no diagnoses linked to this encounter.   Family/ staff Communication: Reviewed plan of care with patient  Labs/tests ordered: None   Next Appointment :   Caesar Bookman, NP

## 2022-09-16 DIAGNOSIS — R5382 Chronic fatigue, unspecified: Secondary | ICD-10-CM | POA: Insufficient documentation

## 2022-09-16 DIAGNOSIS — E785 Hyperlipidemia, unspecified: Secondary | ICD-10-CM | POA: Insufficient documentation

## 2022-09-16 DIAGNOSIS — E538 Deficiency of other specified B group vitamins: Secondary | ICD-10-CM | POA: Insufficient documentation

## 2022-09-16 DIAGNOSIS — N183 Chronic kidney disease, stage 3 unspecified: Secondary | ICD-10-CM | POA: Insufficient documentation

## 2022-09-16 DIAGNOSIS — G8929 Other chronic pain: Secondary | ICD-10-CM | POA: Insufficient documentation

## 2022-09-17 ENCOUNTER — Ambulatory Visit
Admission: RE | Admit: 2022-09-17 | Discharge: 2022-09-17 | Disposition: A | Discharge status: Home / Self Care | Payer: Medicare Other | Source: Ambulatory Visit | Attending: Student | Admitting: Student

## 2022-09-17 ENCOUNTER — Other Ambulatory Visit: Payer: Self-pay | Admitting: Student

## 2022-09-17 DIAGNOSIS — N183 Chronic kidney disease, stage 3 unspecified: Secondary | ICD-10-CM

## 2022-09-22 ENCOUNTER — Other Ambulatory Visit: Payer: Medicare Other

## 2022-09-22 DIAGNOSIS — E538 Deficiency of other specified B group vitamins: Secondary | ICD-10-CM

## 2022-09-22 DIAGNOSIS — Z1159 Encounter for screening for other viral diseases: Secondary | ICD-10-CM

## 2022-09-22 DIAGNOSIS — G8929 Other chronic pain: Secondary | ICD-10-CM

## 2022-09-22 DIAGNOSIS — E559 Vitamin D deficiency, unspecified: Secondary | ICD-10-CM

## 2022-09-22 DIAGNOSIS — N183 Chronic kidney disease, stage 3 unspecified: Secondary | ICD-10-CM

## 2022-09-22 DIAGNOSIS — Z5181 Encounter for therapeutic drug level monitoring: Secondary | ICD-10-CM

## 2022-09-22 DIAGNOSIS — E785 Hyperlipidemia, unspecified: Secondary | ICD-10-CM

## 2022-09-23 LAB — DRUG MONITORING, PANEL 8 WITH CONFIRMATION, URINE
6 Acetylmorphine: NEGATIVE ng/mL (ref <10)
Alcohol Metabolites: NEGATIVE ng/mL (ref <500)
Amphetamines: NEGATIVE ng/mL (ref <500)
Benzodiazepines: NEGATIVE ng/mL (ref <100)
Buprenorphine, Urine: NEGATIVE ng/mL (ref <5)
Cocaine Metabolite: NEGATIVE ng/mL (ref <150)
Creatinine: 257.5 mg/dL (ref >=20.0)
MDMA: NEGATIVE ng/mL (ref <500)
Marijuana Metabolite: NEGATIVE ng/mL (ref <20)
Opiates: NEGATIVE ng/mL (ref <100)
Oxidant: NEGATIVE ug/mL (ref <200)
Oxycodone: NEGATIVE ng/mL (ref <100)
pH: 5.4 (ref 4.5–9.0)

## 2022-09-23 LAB — DM TEMPLATE

## 2022-09-25 LAB — CBC WITH DIFFERENTIAL/PLATELET
Absolute Monocytes: 452 cells/uL (ref 200–950)
Basophils Absolute: 32 cells/uL (ref 0–200)
Basophils Relative: 0.8 %
Eosinophils Absolute: 180 cells/uL (ref 15–500)
Eosinophils Relative: 4.5 %
HCT: 35.3 % — ABNORMAL LOW (ref 38.5–50.0)
Hemoglobin: 11.7 g/dL — ABNORMAL LOW (ref 13.2–17.1)
Lymphs Abs: 1264 cells/uL (ref 850–3900)
MCH: 29.8 pg (ref 27.0–33.0)
MCHC: 33.1 g/dL (ref 32.0–36.0)
MCV: 89.8 fL (ref 80.0–100.0)
MPV: 9.1 fL (ref 7.5–12.5)
Monocytes Relative: 11.3 %
Neutro Abs: 2072 cells/uL (ref 1500–7800)
Neutrophils Relative %: 51.8 %
Platelets: 400 10*3/uL (ref 140–400)
RBC: 3.93 10*6/uL — ABNORMAL LOW (ref 4.20–5.80)
RDW: 12.5 % (ref 11.0–15.0)
Total Lymphocyte: 31.6 %
WBC: 4 10*3/uL (ref 3.8–10.8)

## 2022-09-25 LAB — COMPLETE METABOLIC PANEL WITH GFR
AG Ratio: 1.1 (calc) (ref 1.0–2.5)
ALT: 11 U/L (ref 9–46)
AST: 13 U/L (ref 10–35)
Albumin: 3.8 g/dL (ref 3.6–5.1)
Alkaline phosphatase (APISO): 66 U/L (ref 35–144)
BUN/Creatinine Ratio: 14 (calc) (ref 6–22)
BUN: 22 mg/dL (ref 7–25)
CO2: 28 mmol/L (ref 20–32)
Calcium: 9.1 mg/dL (ref 8.6–10.3)
Chloride: 104 mmol/L (ref 98–110)
Creat: 1.52 mg/dL — ABNORMAL HIGH (ref 0.70–1.35)
Globulin: 3.5 g/dL (calc) (ref 1.9–3.7)
Glucose, Bld: 106 mg/dL — ABNORMAL HIGH (ref 65–99)
Potassium: 4.2 mmol/L (ref 3.5–5.3)
Sodium: 140 mmol/L (ref 135–146)
Total Bilirubin: 0.3 mg/dL (ref 0.2–1.2)
Total Protein: 7.3 g/dL (ref 6.1–8.1)
eGFR: 50 mL/min/{1.73_m2} — ABNORMAL LOW (ref >=60)

## 2022-09-25 LAB — VITAMIN D 1,25 DIHYDROXY
Vitamin D 1, 25 (OH)2 Total: 39 pg/mL (ref 18–72)
Vitamin D2 1, 25 (OH)2: <8 pg/mL
Vitamin D3 1, 25 (OH)2: 39 pg/mL

## 2022-09-25 LAB — LIPID PANEL
Cholesterol: 191 mg/dL (ref <200)
HDL: 40 mg/dL (ref >=40)
LDL Cholesterol (Calc): 132 mg/dL (calc) — ABNORMAL HIGH
Non-HDL Cholesterol (Calc): 151 mg/dL (calc) — ABNORMAL HIGH (ref <130)
Total CHOL/HDL Ratio: 4.8 (calc) (ref <5.0)
Triglycerides: 91 mg/dL (ref <150)

## 2022-09-25 LAB — VITAMIN B12: Vitamin B-12: 527 pg/mL (ref 200–1100)

## 2022-09-25 LAB — HEPATITIS C ANTIBODY: Hepatitis C Ab: NONREACTIVE

## 2022-09-25 LAB — TSH: TSH: 3.1 mIU/L (ref 0.40–4.50)

## 2022-10-01 ENCOUNTER — Telehealth: Payer: Self-pay

## 2022-10-01 NOTE — Telephone Encounter (Signed)
When giving patient results messages patient reported that he would like to speak start speaking with the provider instead of different CMA's in the office about his results. He was advised that he could schedule a MyChart video visit or send a MyChart message to speak about his provider. He reports that's its like talking to 2 different people in a supermarket and he was advised that every CMA helps out in the in baskets for every provider and Carilyn Goodpasture was out the office and another provider is covering her in basket until she comes back. Patient was advised again he can send a MyChart message to his PCP or schedule a MyChart video visit.

## 2022-10-18 ENCOUNTER — Other Ambulatory Visit: Payer: Self-pay | Admitting: Family

## 2022-10-18 DIAGNOSIS — I129 Hypertensive chronic kidney disease with stage 1 through stage 4 chronic kidney disease, or unspecified chronic kidney disease: Secondary | ICD-10-CM

## 2022-10-18 NOTE — Progress Notes (Signed)
Referral ordered to Nephrology.

## 2023-01-19 ENCOUNTER — Ambulatory Visit: Payer: Medicare Other | Admitting: Family

## 2023-02-11 ENCOUNTER — Ambulatory Visit: Payer: Medicare Other | Admitting: Family

## 2023-07-22 ENCOUNTER — Telehealth: Payer: Self-pay | Admitting: Podiatry

## 2023-07-22 NOTE — Telephone Encounter (Signed)
 Roland from [facility] called on behalf of PT to see if his orthotics was back and/or how long they take to return. Pt stated he has not heard anything since he dropped them off abut 2 wks ago. Roland could not provided a direct line to return call and was advise to call back on Monday.

## 2023-07-26 ENCOUNTER — Telehealth: Payer: Self-pay | Admitting: Podiatry

## 2023-07-26 NOTE — Telephone Encounter (Signed)
 Pt called with insurance ([employer]) on the other line and asked to talk with Anabell a few weeks ago. He was asking to talk with Anabell and she was not in the office at that time.  When he talked with Anabell it was about orthotics from 2023 I believe.He asked for a number to call corporate to file a complaint. I gave him the [facility] billing number and The [employer] gentleman gave me the provider service number [phone].  I called pt back and left message for him with the number to corporate to file a grievance [phone].

## 2023-11-23 NOTE — Telephone Encounter (Signed)
 Anabel gave me patients old orthotics and states he is coming in in July for new pair at [facility] for replacement as he had issues with staff prior and filed complaint. However I called patient and LM asking if he wants me to send this pair I was given out for refurbish or just hold until July appt. Wtg for call back I left number and email to where he can get a hold of me  Lolita Schultze Cped, CFo, CFm

## 2023-12-06 ENCOUNTER — Ambulatory Visit

## 2023-12-06 NOTE — Progress Notes (Signed)
 Patient was seen and re-evaluated for Custom Fo's  Patient has previous pair that has never worked well never gave support per patient  Re-making orthotics at no charge

## 2023-12-20 ENCOUNTER — Other Ambulatory Visit

## 2024-01-05 ENCOUNTER — Ambulatory Visit

## 2024-01-05 NOTE — Progress Notes (Signed)
 Patient was present and fit with new CFO's  much softer base / [place]  Patient so far states they feel much better already  Patient will try and will call if any problems arise  Lolita Schultze CPed, CFo, CFm
